# Patient Record
Sex: Male | Born: 1965 | Race: Black or African American | Hispanic: No | Marital: Single | Smoking: Never smoker
Health system: Southern US, Community
[De-identification: ages and names within clinical notes are randomized; demographics above are authoritative.]

## PROBLEM LIST (undated history)

## (undated) DIAGNOSIS — D5740 Sickle-cell thalassemia without crisis: Secondary | ICD-10-CM

## (undated) DIAGNOSIS — I10 Essential (primary) hypertension: Secondary | ICD-10-CM

## (undated) HISTORY — DX: Essential (primary) hypertension: I10

## (undated) HISTORY — DX: Sickle-cell thalassemia without crisis: D57.40

## (undated) HISTORY — PX: OTHER SURGICAL HISTORY: SHX169

---

## 2014-03-08 DIAGNOSIS — Z833 Family history of diabetes mellitus: Secondary | ICD-10-CM | POA: Insufficient documentation

## 2014-03-08 DIAGNOSIS — D5740 Sickle-cell thalassemia without crisis: Secondary | ICD-10-CM

## 2014-03-08 DIAGNOSIS — I10 Essential (primary) hypertension: Secondary | ICD-10-CM | POA: Insufficient documentation

## 2014-03-08 HISTORY — DX: Sickle-cell thalassemia without crisis: D57.40

## 2014-09-14 ENCOUNTER — Ambulatory Visit: Payer: Self-pay | Admitting: Family Medicine

## 2014-09-21 ENCOUNTER — Encounter: Payer: Self-pay | Admitting: Family Medicine

## 2014-09-21 ENCOUNTER — Ambulatory Visit (INDEPENDENT_AMBULATORY_CARE_PROVIDER_SITE_OTHER): Payer: Managed Care, Other (non HMO) | Admitting: Family Medicine

## 2014-09-21 VITALS — BP 194/104 | HR 63 | Ht 73.0 in | Wt 213.0 lb

## 2014-09-21 DIAGNOSIS — I10 Essential (primary) hypertension: Secondary | ICD-10-CM

## 2014-09-21 DIAGNOSIS — N529 Male erectile dysfunction, unspecified: Secondary | ICD-10-CM

## 2014-09-21 DIAGNOSIS — R200 Anesthesia of skin: Secondary | ICD-10-CM

## 2014-09-21 LAB — COMPLETE METABOLIC PANEL WITH GFR
ALBUMIN: 4.3 g/dL (ref 3.5–5.2)
ALT: 10 U/L (ref 0–53)
AST: 18 U/L (ref 0–37)
Alkaline Phosphatase: 50 U/L (ref 39–117)
BUN: 16 mg/dL (ref 6–23)
CALCIUM: 8.9 mg/dL (ref 8.4–10.5)
CO2: 25 mEq/L (ref 19–32)
Chloride: 104 mEq/L (ref 96–112)
Creat: 1.51 mg/dL — ABNORMAL HIGH (ref 0.50–1.35)
GFR, Est African American: 62 mL/min
GFR, Est Non African American: 53 mL/min — ABNORMAL LOW
GLUCOSE: 86 mg/dL (ref 70–99)
POTASSIUM: 3.7 meq/L (ref 3.5–5.3)
Sodium: 142 mEq/L (ref 135–145)
TOTAL PROTEIN: 7.4 g/dL (ref 6.0–8.3)
Total Bilirubin: 1.1 mg/dL (ref 0.2–1.2)

## 2014-09-21 LAB — LIPID PANEL
CHOL/HDL RATIO: 2.3 ratio
CHOLESTEROL: 144 mg/dL (ref 0–200)
HDL: 62 mg/dL (ref >=40)
LDL CALC: 74 mg/dL (ref 0–99)
Triglycerides: 39 mg/dL (ref <150)
VLDL: 8 mg/dL (ref 0–40)

## 2014-09-21 MED ORDER — LISINOPRIL-HYDROCHLOROTHIAZIDE 20-12.5 MG PO TABS: 1.0000 | ORAL_TABLET | Freq: Every day | ORAL | Status: DC

## 2014-09-21 MED ORDER — TADALAFIL 20 MG PO TABS: 20.0000 mg | ORAL_TABLET | Freq: Every day | ORAL | Status: DC | PRN

## 2014-09-21 NOTE — Progress Notes (Signed)
Isaac Bowen is a 49 y.o. male who presents to [facility]  today for establish care, discuss hypertension, and discuss groin numbness. 1) hypertension ongoing for years. Previously well controlled with lisinopril hydrochlorothiazide combination pill. He has been out of the pill for about a month. No chest pains palpitations or shortness of breath.  2) groin numbness present since February. Patient notes numbness from his left perirectal area into his left scrotum into the tip of the left side of his penis. He denies any injury back pain or incontinence. He notes some post void dribbling. He has not tried any medications for this problem. Additionally he notes difficulty sustaining an erection. This is made his sex life very challenging. He is interested in ED medications if possible.   Past Medical History  Diagnosis Date  . Hypertension    Past Surgical History  Procedure Laterality Date  . Orthoscopic     History  Substance Use Topics  . Smoking status: Never Smoker   . Smokeless tobacco: Not on file  . Alcohol Use: 0.0 oz/week    0 Standard drinks or equivalent per week   ROS as above Medications: Current Outpatient Prescriptions  Medication Sig Dispense Refill  . lisinopril-hydrochlorothiazide (PRINZIDE,ZESTORETIC) 20-12.5 MG per tablet Take 1 tablet by mouth daily. 90 tablet 1  . tadalafil (CIALIS) 20 MG tablet Take 1 tablet (20 mg total) by mouth daily as needed for erectile dysfunction. 10 tablet 0   No current facility-administered medications for this visit.   No Known Allergies   Exam:  BP 194/104 mmHg  Pulse 63  Ht 6\' 1"  (1.854 m)  Wt 213 lb (96.616 kg)  BMI 28.11 kg/m2 Gen: Well NAD HEENT: EOMI,  MMM Lungs: Normal work of breathing. CTABL Heart: RRR no MRG Abd: NABS, Soft. Nondistended, Nontender Exts: Brisk capillary refill, warm and well perfused.  Genital: No inguinal lymphadenopathy. Small skin tags present right  scrotum Testicles descended bilaterally nontender without masses. Penis is uncircumcised and normal appearing. Subjective decreased sensation left scrotum and penis Rectal: Normal anus. Normal rectal tone both active and relax. Prostate is slightly large smooth without masses. Nontender.  No results found for this or any previous visit (from the past 24 hour(s)). No results found.   Please see individual assessment and plan sections.

## 2014-09-21 NOTE — Patient Instructions (Signed)
Thank you for coming in today. Return in 1 month to recheck blood pressure and labs.  Call or go to the emergency room if you get worse, have trouble breathing, have chest pains, or palpitations.  Come back or go to the emergency room if you notice new weakness new numbness problems walking or bowel or bladder problems.  Hypertension Hypertension, commonly called high blood pressure, is when the force of blood pumping through your arteries is too strong. Your arteries are the blood vessels that carry blood from your heart throughout your body. A blood pressure reading consists of a higher number over a lower number, such as 110/72. The higher number (systolic) is the pressure inside your arteries when your heart pumps. The lower number (diastolic) is the pressure inside your arteries when your heart relaxes. Ideally you want your blood pressure below 120/80. Hypertension forces your heart to work harder to pump blood. Your arteries may become narrow or stiff. Having hypertension puts you at risk for heart disease, stroke, and other problems.  RISK FACTORS Some risk factors for high blood pressure are controllable. Others are not.  Risk factors you cannot control include:   Race. You may be at higher risk if you are African American.  Age. Risk increases with age.  Gender. Men are at higher risk than women before age 61 years. After age 65, women are at higher risk than men. Risk factors you can control include:  Not getting enough exercise or physical activity.  Being overweight.  Getting too much fat, sugar, calories, or salt in your diet.  Drinking too much alcohol. SIGNS AND SYMPTOMS Hypertension does not usually cause signs or symptoms. Extremely high blood pressure (hypertensive crisis) may cause headache, anxiety, shortness of breath, and nosebleed. DIAGNOSIS  To check if you have hypertension, your health care provider will measure your blood pressure while you are seated, with  your arm held at the level of your heart. It should be measured at least twice using the same arm. Certain conditions can cause a difference in blood pressure between your right and left arms. A blood pressure reading that is higher than normal on one occasion does not mean that you need treatment. If one blood pressure reading is high, ask your health care provider about having it checked again. TREATMENT  Treating high blood pressure includes making lifestyle changes and possibly taking medicine. Living a healthy lifestyle can help lower high blood pressure. You may need to change some of your habits. Lifestyle changes may include:  Following the DASH diet. This diet is high in fruits, vegetables, and whole grains. It is low in salt, red meat, and added sugars.  Getting at least 2 hours of brisk physical activity every week.  Losing weight if necessary.  Not smoking.  Limiting alcoholic beverages.  Learning ways to reduce stress. If lifestyle changes are not enough to get your blood pressure under control, your health care provider may prescribe medicine. You may need to take more than one. Work closely with your health care provider to understand the risks and benefits. HOME CARE INSTRUCTIONS  Have your blood pressure rechecked as directed by your health care provider.   Take medicines only as directed by your health care provider. Follow the directions carefully. Blood pressure medicines must be taken as prescribed. The medicine does not work as well when you skip doses. Skipping doses also puts you at risk for problems.   Do not smoke.   Monitor your blood pressure  at home as directed by your health care provider. SEEK MEDICAL CARE IF:   You think you are having a reaction to medicines taken.  You have recurrent headaches or feel dizzy.  You have swelling in your ankles.  You have trouble with your vision. SEEK IMMEDIATE MEDICAL CARE IF:  You develop a severe headache  or confusion.  You have unusual weakness, numbness, or feel faint.  You have severe chest or abdominal pain.  You vomit repeatedly.  You have trouble breathing. MAKE SURE YOU:   Understand these instructions.  Will watch your condition.  Will get help right away if you are not doing well or get worse. Document Released: 02/18/2005 Document Revised: 07/05/2013 Document Reviewed: 12/11/2012 [facility] Patient Information 2015 [place], [place]. This information is not intended to replace advice given to you by your health care provider. Make sure you discuss any questions you have with your health care provider.

## 2014-09-21 NOTE — Assessment & Plan Note (Signed)
Left groin numbness. Concern with a sacral nerve root impingement. X-ray of lumbar spine and sacrum pending. We'll consider MRI in the near future.

## 2014-09-21 NOTE — Assessment & Plan Note (Addendum)
Rx  lisinopril/hydrocodone as a prescription. Check metabolic panel, lipid panel, and CBC. Return in one month for blood pressure recheck

## 2014-09-21 NOTE — Assessment & Plan Note (Signed)
Likely related to numbness. Workup as above. Gave 3 samples of cilias and a rx for 10 20mg  tabs.

## 2014-09-22 ENCOUNTER — Telehealth: Payer: Self-pay | Admitting: Family Medicine

## 2014-09-22 DIAGNOSIS — N183 Chronic kidney disease, stage 3 unspecified: Secondary | ICD-10-CM | POA: Insufficient documentation

## 2014-09-22 LAB — CBC
HEMATOCRIT: 38.3 % — AB (ref 39.0–52.0)
HEMOGLOBIN: 13.2 g/dL (ref 13.0–17.0)
MCH: 27.6 pg (ref 26.0–34.0)
MCHC: 34.5 g/dL (ref 30.0–36.0)
MCV: 80 fL (ref 78.0–100.0)
PLATELETS: 128 10*3/uL — AB (ref 150–400)
RBC: 4.79 MIL/uL (ref 4.22–5.81)
RDW: 15.9 % — ABNORMAL HIGH (ref 11.5–15.5)
WBC: 4.2 10*3/uL (ref 4.0–10.5)

## 2014-09-22 NOTE — Telephone Encounter (Signed)
Creatine elevated.  No change in medicine.  Pt need MD visit in 1 month to recheck BP and check kidney function again.

## 2014-09-23 NOTE — Telephone Encounter (Signed)
Pt notified of results and recommendations.

## 2014-10-20 ENCOUNTER — Encounter: Payer: Self-pay | Admitting: Family Medicine

## 2014-10-20 ENCOUNTER — Ambulatory Visit (INDEPENDENT_AMBULATORY_CARE_PROVIDER_SITE_OTHER): Payer: Managed Care, Other (non HMO) | Admitting: Family Medicine

## 2014-10-20 VITALS — BP 177/100 | HR 75 | Wt 216.0 lb

## 2014-10-20 DIAGNOSIS — N183 Chronic kidney disease, stage 3 unspecified: Secondary | ICD-10-CM

## 2014-10-20 DIAGNOSIS — I10 Essential (primary) hypertension: Secondary | ICD-10-CM | POA: Diagnosis not present

## 2014-10-20 LAB — BASIC METABOLIC PANEL
BUN: 13 mg/dL (ref 7–25)
CO2: 24 mmol/L (ref 20–31)
Calcium: 9 mg/dL (ref 8.6–10.3)
Chloride: 104 mmol/L (ref 98–110)
Creat: 1.39 mg/dL — ABNORMAL HIGH (ref 0.60–1.35)
Glucose, Bld: 75 mg/dL (ref 65–99)
Potassium: 4.5 mmol/L (ref 3.5–5.3)
Sodium: 137 mmol/L (ref 135–146)

## 2014-10-20 MED ORDER — AMLODIPINE BESYLATE 10 MG PO TABS
10.0000 mg | ORAL_TABLET | Freq: Every day | ORAL | Status: DC
Start: 2014-10-20 — End: 2015-03-24

## 2014-10-20 NOTE — Progress Notes (Signed)
Isaac Bowen is a 49 y.o. male who presents to [facility]  today for follow-up hypertension. Patient was seen about a month ago for hypertension and was started on lisinopril hydrochlorothiazide. Labs were obtained and he was found to have CKD stage III with a creatinine of 1.5. He feels well otherwise with no chest pain palpitations or shortness of breath. He's tolerating the blood pressure medication well.   Past Medical History  Diagnosis Date  . Hypertension    Past Surgical History  Procedure Laterality Date  . Orthoscopic     Social History  Substance Use Topics  . Smoking status: Never Smoker   . Smokeless tobacco: Not on file  . Alcohol Use: 0.0 oz/week    0 Standard drinks or equivalent per week   ROS as above Medications: Current Outpatient Prescriptions  Medication Sig Dispense Refill  . lisinopril-hydrochlorothiazide (PRINZIDE,ZESTORETIC) 20-12.5 MG per tablet Take 1 tablet by mouth daily. 90 tablet 1  . tadalafil (CIALIS) 20 MG tablet Take 1 tablet (20 mg total) by mouth daily as needed for erectile dysfunction. 10 tablet 0  . amLODipine (NORVASC) 10 MG tablet Take 1 tablet (10 mg total) by mouth daily. 30 tablet 3   No current facility-administered medications for this visit.   No Known Allergies   Exam:  BP 177/100 mmHg  Pulse 75  Wt 216 lb (97.977 kg) Gen: Well NAD HEENT: EOMI,  MMM Lungs: Normal work of breathing. CTABL Heart: RRR no MRG Abd: NABS, Soft. Nondistended, Nontender Exts: Brisk capillary refill, warm and well perfused.   No results found for this or any previous visit (from the past 24 hour(s)). No results found.   Please see individual assessment and plan sections.

## 2014-10-20 NOTE — Assessment & Plan Note (Signed)
Not well-controlled. Increased blood pressure medicine to amlodipine. Continue with lisinopril hydrochlorothiazide

## 2014-10-20 NOTE — Assessment & Plan Note (Signed)
Recheck metabolic panel today.   

## 2014-10-20 NOTE — Patient Instructions (Signed)
Thank you for coming in today. Return in 3-4 weeks.  Call or go to the emergency room if you get worse, have trouble breathing, have chest pains, or palpitations.   Start amlodipine daily.

## 2014-10-21 NOTE — Progress Notes (Signed)
Quick Note:  Normal, no changes. ______ 

## 2014-11-18 ENCOUNTER — Ambulatory Visit: Payer: Managed Care, Other (non HMO) | Admitting: Family Medicine

## 2014-11-25 ENCOUNTER — Encounter: Payer: Self-pay | Admitting: Family Medicine

## 2014-11-25 ENCOUNTER — Ambulatory Visit (INDEPENDENT_AMBULATORY_CARE_PROVIDER_SITE_OTHER): Payer: Managed Care, Other (non HMO) | Admitting: Family Medicine

## 2014-11-25 VITALS — BP 152/100 | HR 68 | Wt 218.0 lb

## 2014-11-25 DIAGNOSIS — N529 Male erectile dysfunction, unspecified: Secondary | ICD-10-CM | POA: Diagnosis not present

## 2014-11-25 DIAGNOSIS — I10 Essential (primary) hypertension: Secondary | ICD-10-CM | POA: Diagnosis not present

## 2014-11-25 MED ORDER — SILDENAFIL CITRATE 20 MG PO TABS: 20.0000 mg | ORAL_TABLET | ORAL | Status: DC | PRN

## 2014-11-25 MED ORDER — METOPROLOL SUCCINATE ER 50 MG PO TB24: 50.0000 mg | ORAL_TABLET | Freq: Every day | ORAL | Status: DC

## 2014-11-25 NOTE — Assessment & Plan Note (Signed)
Switch to generic sildenafil at [facility]

## 2014-11-25 NOTE — Progress Notes (Signed)
Isaac Bowen is a 49 y.o. male who presents to [place]  today for blood pressure follow-up. He has taken the lisinopril/hydrochlorothiazide combination tablet as well as the amlodipine. He feels well with no side effects. No chest pains palpitations or shortness of breath. He feels well otherwise.  Erectile dysfunction: Patient continues to experience erectile dysfunction. He was unable to fill the Cialis as it was too expensive.    Past Medical History  Diagnosis Date  . Hypertension    Past Surgical History  Procedure Laterality Date  . Orthoscopic     Social History  Substance Use Topics  . Smoking status: Never Smoker   . Smokeless tobacco: Not on file  . Alcohol Use: 0.0 oz/week    0 Standard drinks or equivalent per week   family history includes Cancer in his mother; Diabetes in his father; Hyperlipidemia in his father; Hypertension in his brother and father; Stroke in his father.  ROS as above Medications: Current Outpatient Prescriptions  Medication Sig Dispense Refill  . amLODipine (NORVASC) 10 MG tablet Take 1 tablet (10 mg total) by mouth daily. 30 tablet 3  . lisinopril-hydrochlorothiazide (PRINZIDE,ZESTORETIC) 20-12.5 MG per tablet Take 1 tablet by mouth daily. 90 tablet 1  . metoprolol succinate (TOPROL XL) 50 MG 24 hr tablet Take 1 tablet (50 mg total) by mouth daily. Take with or immediately following a meal. 30 tablet 1  . sildenafil (REVATIO) 20 MG tablet Take 1 tablet (20 mg total) by mouth as needed. 50 tablet 11   No current facility-administered medications for this visit.   No Known Allergies   Exam:  BP 152/100 mmHg  Pulse 68  Wt 218 lb (98.884 kg) Gen: Well NAD HEENT: EOMI,  MMM Lungs: Normal work of breathing. CTABL Heart: RRR no MRG Abd: NABS, Soft. Nondistended, Nontender Exts: Brisk capillary refill, warm and well perfused.   No results found for this or any previous visit (from the past 24  hour(s)). No results found.   Please see individual assessment and plan sections.

## 2014-11-25 NOTE — Patient Instructions (Signed)
Thank you for coming in today. Start metoprolol daily.  Continue other blood pressure medicine . Call [facility] [phone] about the viagra rx.  Call or go to the emergency room if you get worse, have trouble breathing, have chest pains, or palpitations.   Return in 1 month.

## 2014-11-25 NOTE — Assessment & Plan Note (Signed)
Add metoprolol. Recheck 1 month

## 2015-03-24 ENCOUNTER — Encounter: Payer: Self-pay | Admitting: Family Medicine

## 2015-03-24 ENCOUNTER — Ambulatory Visit (INDEPENDENT_AMBULATORY_CARE_PROVIDER_SITE_OTHER): Payer: Managed Care, Other (non HMO) | Admitting: Family Medicine

## 2015-03-24 VITALS — BP 200/121 | HR 73 | Ht 73.0 in | Wt 221.0 lb

## 2015-03-24 DIAGNOSIS — N183 Chronic kidney disease, stage 3 unspecified: Secondary | ICD-10-CM

## 2015-03-24 DIAGNOSIS — Z114 Encounter for screening for human immunodeficiency virus [HIV]: Secondary | ICD-10-CM

## 2015-03-24 DIAGNOSIS — Z23 Encounter for immunization: Secondary | ICD-10-CM | POA: Diagnosis not present

## 2015-03-24 DIAGNOSIS — I10 Essential (primary) hypertension: Secondary | ICD-10-CM

## 2015-03-24 LAB — RENAL FUNCTION PANEL
ALBUMIN: 4.4 g/dL (ref 3.6–5.1)
BUN: 13 mg/dL (ref 7–25)
CALCIUM: 8.7 mg/dL (ref 8.6–10.3)
CHLORIDE: 107 mmol/L (ref 98–110)
CO2: 26 mmol/L (ref 20–31)
CREATININE: 1.52 mg/dL — AB (ref 0.60–1.35)
Glucose, Bld: 78 mg/dL (ref 65–99)
PHOSPHORUS: 1.9 mg/dL — AB (ref 2.5–4.5)
Potassium: 3.9 mmol/L (ref 3.5–5.3)
SODIUM: 142 mmol/L (ref 135–146)

## 2015-03-24 MED ORDER — LISINOPRIL-HYDROCHLOROTHIAZIDE 20-12.5 MG PO TABS: 1.0000 | ORAL_TABLET | Freq: Every day | ORAL | Status: DC

## 2015-03-24 MED ORDER — AMLODIPINE BESYLATE 10 MG PO TABS: 10.0000 mg | ORAL_TABLET | Freq: Every day | ORAL | Status: DC

## 2015-03-24 NOTE — Progress Notes (Signed)
       Isaac Bowen is a 50 y.o. male who presents to [place] today for follow-up hypertension, CKD.  1) hypertension: Patient was last seen in September and was taking amlodipine and lisinopril/hydrochlorothiazide. This was insufficient to completely control his blood pressure. He was started on metoprolol and asked to follow-up in one month. He was lost to follow-up. He did not realize he needed to make a new appointment. Of note he was unable to tolerate the metoprolol stating that it caused diarrhea and headaches. Currently he feels well with no chest pains palpitations or shortness of breath or swelling.  2) chronic kidney disease: Patient has stage III chronic kidney disease. This has not been fully evaluated at this point. He denies any leg swelling.  Past Medical History  Diagnosis Date  . Hypertension    Past Surgical History  Procedure Laterality Date  . Orthoscopic     Social History  Substance Use Topics  . Smoking status: Never Smoker   . Smokeless tobacco: Not on file  . Alcohol Use: 0.0 oz/week    0 Standard drinks or equivalent per week   family history includes Cancer in his mother; Diabetes in his father; Hyperlipidemia in his father; Hypertension in his brother and father; Stroke in his father.  ROS as above Medications: Current Outpatient Prescriptions  Medication Sig Dispense Refill  . amLODipine (NORVASC) 10 MG tablet Take 1 tablet (10 mg total) by mouth daily. 30 tablet 0  . lisinopril-hydrochlorothiazide (PRINZIDE,ZESTORETIC) 20-12.5 MG tablet Take 1 tablet by mouth daily. 30 tablet 0  . sildenafil (REVATIO) 20 MG tablet Take 1 tablet (20 mg total) by mouth as needed. 50 tablet 11   No current facility-administered medications for this visit.   Allergies  Allergen Reactions  . Metoprolol Diarrhea and Other (See Comments)    Chest pain     Exam:  BP  200/121 mmHg  Pulse 73  Ht 6\' 1"  (1.854 m)  Wt 221 lb (100.245 kg)  BMI 29.16 kg/m2 Gen: Well NAD HEENT: EOMI,  MMM Lungs: Normal work of breathing. CTABL Heart: RRR no MRG Abd: NABS, Soft. Nondistended, Nontender Exts: Brisk capillary refill, warm and well perfused. No lower extremity edema present.  Patient was given Tdap, and influenza vaccines today prior to discharge.  No results found for this or any previous visit (from the past 24 hour(s)). No results found.   Please see individual assessment and plan sections.

## 2015-03-24 NOTE — Assessment & Plan Note (Signed)
Check renal function panel. Patient likely would benefit from referral to nephrology if still stage III or worse.

## 2015-03-24 NOTE — Patient Instructions (Signed)
Thank you for coming in today. Return in 2-3 weeks.  Restart blood pressure medicine.  Get labs today.

## 2015-03-24 NOTE — Assessment & Plan Note (Signed)
Uncontrolled. Restart amlodipine and hydrochlorothiazide/lisinopril. Check renal function panel. Return in 2-3 weeks.

## 2015-03-25 LAB — HIV ANTIBODY (ROUTINE TESTING W REFLEX): HIV 1&2 Ab, 4th Generation: NONREACTIVE

## 2015-03-27 ENCOUNTER — Encounter: Payer: Self-pay | Admitting: Family Medicine

## 2015-03-27 DIAGNOSIS — N183 Chronic kidney disease, stage 3 unspecified: Secondary | ICD-10-CM | POA: Insufficient documentation

## 2015-03-27 NOTE — Progress Notes (Signed)
Quick Note:  Labs do not show much change. This is good. ______

## 2015-04-13 ENCOUNTER — Encounter: Payer: Self-pay | Admitting: Family Medicine

## 2015-04-13 ENCOUNTER — Ambulatory Visit (INDEPENDENT_AMBULATORY_CARE_PROVIDER_SITE_OTHER): Payer: Managed Care, Other (non HMO) | Admitting: Family Medicine

## 2015-04-13 VITALS — BP 133/74 | HR 69 | Wt 231.0 lb

## 2015-04-13 DIAGNOSIS — N529 Male erectile dysfunction, unspecified: Secondary | ICD-10-CM

## 2015-04-13 DIAGNOSIS — N182 Chronic kidney disease, stage 2 (mild): Secondary | ICD-10-CM

## 2015-04-13 DIAGNOSIS — I10 Essential (primary) hypertension: Secondary | ICD-10-CM

## 2015-04-13 LAB — BASIC METABOLIC PANEL WITH GFR
BUN: 14 mg/dL (ref 7–25)
CO2: 27 mmol/L (ref 20–31)
Calcium: 9 mg/dL (ref 8.6–10.3)
Chloride: 104 mmol/L (ref 98–110)
Creat: 1.37 mg/dL — ABNORMAL HIGH (ref 0.60–1.35)
GFR, EST NON AFRICAN AMERICAN: 60 mL/min (ref >=60)
GFR, Est African American: 70 mL/min (ref >=60)
Glucose, Bld: 90 mg/dL (ref 65–99)
POTASSIUM: 4.3 mmol/L (ref 3.5–5.3)
SODIUM: 138 mmol/L (ref 135–146)

## 2015-04-13 MED ORDER — AMLODIPINE BESYLATE 10 MG PO TABS: 10.0000 mg | ORAL_TABLET | Freq: Every day | ORAL | Status: DC

## 2015-04-13 MED ORDER — LISINOPRIL-HYDROCHLOROTHIAZIDE 20-12.5 MG PO TABS: 1.0000 | ORAL_TABLET | Freq: Every day | ORAL | Status: DC

## 2015-04-13 NOTE — Assessment & Plan Note (Signed)
Chronic, stable based on labs 3 weeks ago. Will check kidney function today given stop and restart of HCTZ and lisinopril. Follow up in 6 months.

## 2015-04-13 NOTE — Progress Notes (Signed)
       Isaac Bowen is a 50 y.o. male who presents to [place] today for follow up hypertension.  HTN: Patient has done well with the medications. No peripheral edema, chest pain, palpitations, headaches, vision changes. No other acute complaints today.   ED: Patient using herbal remedies to adequate success. Will fill Cialis prescription if needed.   Past Medical History  Diagnosis Date  . Hypertension    Past Surgical History  Procedure Laterality Date  . Orthoscopic     Social History  Substance Use Topics  . Smoking status: Never Smoker   . Smokeless tobacco: Not on file  . Alcohol Use: 0.0 oz/week    0 Standard drinks or equivalent per week   family history includes Cancer in his mother; Diabetes in his father; Hyperlipidemia in his father; Hypertension in his brother and father; Stroke in his father.  ROS as above Medications: Current Outpatient Prescriptions  Medication Sig Dispense Refill  . amLODipine (NORVASC) 10 MG tablet Take 1 tablet (10 mg total) by mouth daily. 90 tablet 1  . lisinopril-hydrochlorothiazide (PRINZIDE,ZESTORETIC) 20-12.5 MG tablet Take 1 tablet by mouth daily. 90 tablet 1  . sildenafil (REVATIO) 20 MG tablet Take 1 tablet (20 mg total) by mouth as needed. 50 tablet 11   No current facility-administered medications for this visit.   Allergies  Allergen Reactions  . Metoprolol Diarrhea and Other (See Comments)    Chest pain     Exam:  BP 133/74 mmHg  Pulse 69  Wt 231 lb (104.781 kg)  SpO2 100% Gen: Well appearing gentleman in NAD HEENT: EOMI,  MMM Lungs: Normal work of breathing. CTABL Heart: RRR no MRG Abd: NABS, Soft. Nondistended, Nontender Exts: Brisk capillary refill, warm and well perfused. No peripheral edema noted.   No results found for this or any previous visit (from the past 24 hour(s)). No results found.   Please see  individual assessment and plan sections.

## 2015-04-13 NOTE — Assessment & Plan Note (Signed)
Stable. Patient will start medication if needed.

## 2015-04-13 NOTE — Patient Instructions (Signed)
Thank you for coming in today. You were seen for follow up for your high blood pressure. This looks great today! Keep taking your medication as prescribed. Your medication acts on your kidneys, and since you restarted this medication and had decreased kidney function in the past, we will check your kidney function today. Please come back in 6 months or sooner as needed.

## 2015-04-13 NOTE — Assessment & Plan Note (Signed)
Chronic, well-controlled on current regimen. Continue medications. Check kidney function today. RTC in 6 months.

## 2015-04-14 ENCOUNTER — Ambulatory Visit: Payer: Managed Care, Other (non HMO) | Admitting: Family Medicine

## 2015-04-14 NOTE — Progress Notes (Signed)
Quick Note:    Labs look good.  ______

## 2015-10-11 ENCOUNTER — Encounter: Payer: Self-pay | Admitting: Family Medicine

## 2015-10-11 ENCOUNTER — Ambulatory Visit (INDEPENDENT_AMBULATORY_CARE_PROVIDER_SITE_OTHER): Payer: BLUE CROSS/BLUE SHIELD | Admitting: Family Medicine

## 2015-10-11 VITALS — BP 131/84 | HR 63

## 2015-10-11 DIAGNOSIS — N182 Chronic kidney disease, stage 2 (mild): Secondary | ICD-10-CM | POA: Diagnosis not present

## 2015-10-11 DIAGNOSIS — Z0000 Encounter for general adult medical examination without abnormal findings: Secondary | ICD-10-CM | POA: Diagnosis not present

## 2015-10-11 DIAGNOSIS — Z1211 Encounter for screening for malignant neoplasm of colon: Secondary | ICD-10-CM

## 2015-10-11 DIAGNOSIS — IMO0001 Reserved for inherently not codable concepts without codable children: Secondary | ICD-10-CM

## 2015-10-11 DIAGNOSIS — Z125 Encounter for screening for malignant neoplasm of prostate: Secondary | ICD-10-CM | POA: Diagnosis not present

## 2015-10-11 DIAGNOSIS — I10 Essential (primary) hypertension: Secondary | ICD-10-CM

## 2015-10-11 DIAGNOSIS — D5740 Sickle-cell thalassemia without crisis: Secondary | ICD-10-CM

## 2015-10-11 LAB — CBC
HCT: 35.3 % — ABNORMAL LOW (ref 38.5–50.0)
Hemoglobin: 12 g/dL — ABNORMAL LOW (ref 13.2–17.1)
MCH: 26.9 pg — ABNORMAL LOW (ref 27.0–33.0)
MCHC: 34 g/dL (ref 32.0–36.0)
MCV: 79.1 fL — ABNORMAL LOW (ref 80.0–100.0)
Platelets: 160 10*3/uL (ref 140–400)
RBC: 4.46 MIL/uL (ref 4.20–5.80)
RDW: 16.9 % — ABNORMAL HIGH (ref 11.0–15.0)
WBC: 4.5 10*3/uL (ref 3.8–10.8)

## 2015-10-11 LAB — TSH: TSH: 1.92 m[IU]/L (ref 0.40–4.50)

## 2015-10-11 LAB — PSA: PSA: 1.1 ng/mL

## 2015-10-11 MED ORDER — AMLODIPINE BESYLATE 10 MG PO TABS: 10.0000 mg | ORAL_TABLET | Freq: Every day | ORAL | 1 refills | Status: DC

## 2015-10-11 MED ORDER — LISINOPRIL-HYDROCHLOROTHIAZIDE 20-12.5 MG PO TABS: 1.0000 | ORAL_TABLET | Freq: Every day | ORAL | 1 refills | Status: DC

## 2015-10-11 NOTE — Patient Instructions (Signed)
Thank you for coming in today. Make a nurse visit for flu vaccine soon.  Get labs today.  You should hear from the cologuard people soon about colon cancer screening.  Return in 6 months for a recheck.  Call or go to the emergency room if you get worse, have trouble breathing, have chest pains, or palpitations.

## 2015-10-11 NOTE — Progress Notes (Signed)
       Isaac Bowen is a 50 y.o. male who presents to [place] [place] today for well visit follow-up hypertension and health maintenance. Patient is doing very well over the last 6 months. The denies any significant health complaints no fevers or chills nausea vomiting or diarrhea. He changed jobs to one that has better time off and benefits. He has no abdominal pain chest pain palpitations shortness of breath fevers or chills. He takes the medications listed below. Additionally he notes that he recently got engaged.   Past Medical History:  Diagnosis Date  . Hypertension    Past Surgical History:  Procedure Laterality Date  . orthoscopic     Social History  Substance Use Topics  . Smoking status: Never Smoker  . Smokeless tobacco: Not on file  . Alcohol use 0.0 oz/week   family history includes Cancer in his mother; Diabetes in his father; Hyperlipidemia in his father; Hypertension in his brother and father; Stroke in his father.  ROS as above:  Medications: Current Outpatient Prescriptions  Medication Sig Dispense Refill  . amLODipine (NORVASC) 10 MG tablet Take 1 tablet (10 mg total) by mouth daily. 90 tablet 1  . lisinopril-hydrochlorothiazide (PRINZIDE,ZESTORETIC) 20-12.5 MG tablet Take 1 tablet by mouth daily. 90 tablet 1  . sildenafil (REVATIO) 20 MG tablet Take 1 tablet (20 mg total) by mouth as needed. 50 tablet 11   No current facility-administered medications for this visit.    Allergies  Allergen Reactions  . Metoprolol Diarrhea and Other (See Comments)    Chest pain     Exam:  BP 131/84   Pulse 63  Gen: Well NAD HEENT: EOMI,  MMM Lungs: Normal work of breathing. CTABL Heart: RRR no MRG Abd: NABS, Soft. Nondistended, Nontender Exts: Brisk capillary refill, warm and well perfused.   No results found for this or any previous visit (from the past 24  hour(s)). No results found.    Assessment and Plan: 50 y.o. male with Well visit. Plan to check basic fasting labs. Additionally have ordered Cologuard for colon cancer screening and PSA for prostate cancer screening. We had a lengthy discussion about the uncertainty regarding prostate cancer screening. Additionally will return in the near future for a influenza vaccine. Return in 6 months or sooner if needed.   Orders Placed This Encounter  Procedures  . CBC  . Comprehensive metabolic panel    Order Specific Question:   Has the patient fasted?    Answer:   No  . Lipid panel    Order Specific Question:   Has the patient fasted?    Answer:   No  . PSA  . TSH  . VITAMIN D 25 Hydroxy (Vit-D Deficiency, Fractures)  . Hemoglobin A1c    Discussed warning signs or symptoms. Please see discharge instructions. Patient expresses understanding.

## 2015-10-12 ENCOUNTER — Encounter: Payer: Self-pay | Admitting: Family Medicine

## 2015-10-12 DIAGNOSIS — E559 Vitamin D deficiency, unspecified: Secondary | ICD-10-CM | POA: Insufficient documentation

## 2015-10-12 LAB — COMPREHENSIVE METABOLIC PANEL
ALT: 10 U/L (ref 9–46)
AST: 15 U/L (ref 10–35)
Albumin: 4.5 g/dL (ref 3.6–5.1)
Alkaline Phosphatase: 57 U/L (ref 40–115)
BILIRUBIN TOTAL: 1.1 mg/dL (ref 0.2–1.2)
BUN: 19 mg/dL (ref 7–25)
CO2: 29 mmol/L (ref 20–31)
CREATININE: 1.66 mg/dL — AB (ref 0.70–1.33)
Calcium: 9.1 mg/dL (ref 8.6–10.3)
Chloride: 100 mmol/L (ref 98–110)
GLUCOSE: 91 mg/dL (ref 65–99)
Potassium: 4.3 mmol/L (ref 3.5–5.3)
SODIUM: 139 mmol/L (ref 135–146)
Total Protein: 7 g/dL (ref 6.1–8.1)

## 2015-10-12 LAB — VITAMIN D 25 HYDROXY (VIT D DEFICIENCY, FRACTURES): VIT D 25 HYDROXY: 26 ng/mL — AB (ref 30–100)

## 2015-10-12 LAB — HEMOGLOBIN A1C
HEMOGLOBIN A1C: 4.1 % (ref <5.7)
Mean Plasma Glucose: 71 mg/dL

## 2015-10-12 LAB — LIPID PANEL
Cholesterol: 139 mg/dL (ref 125–200)
HDL: 66 mg/dL (ref >=40)
LDL CALC: 64 mg/dL (ref <130)
TRIGLYCERIDES: 44 mg/dL (ref <150)
Total CHOL/HDL Ratio: 2.1 Ratio (ref <=5.0)
VLDL: 9 mg/dL (ref <30)

## 2015-11-09 LAB — COLOGUARD: COLOGUARD: NEGATIVE

## 2015-12-08 ENCOUNTER — Encounter: Payer: Self-pay | Admitting: Family Medicine

## 2015-12-24 ENCOUNTER — Emergency Department
Admission: EM | Admit: 2015-12-24 | Discharge: 2015-12-24 | Disposition: A | Discharge status: Other Acute Inpatient Hospital | Payer: BLUE CROSS/BLUE SHIELD | Source: Home / Self Care | Attending: Emergency Medicine | Admitting: Emergency Medicine

## 2015-12-24 ENCOUNTER — Encounter: Payer: Self-pay | Admitting: Emergency Medicine

## 2015-12-24 DIAGNOSIS — D696 Thrombocytopenia, unspecified: Secondary | ICD-10-CM | POA: Insufficient documentation

## 2015-12-24 DIAGNOSIS — R1012 Left upper quadrant pain: Secondary | ICD-10-CM

## 2015-12-24 DIAGNOSIS — N183 Chronic kidney disease, stage 3 (moderate): Secondary | ICD-10-CM

## 2015-12-24 DIAGNOSIS — E876 Hypokalemia: Secondary | ICD-10-CM | POA: Insufficient documentation

## 2015-12-24 DIAGNOSIS — D735 Infarction of spleen: Secondary | ICD-10-CM | POA: Insufficient documentation

## 2015-12-24 DIAGNOSIS — N179 Acute kidney failure, unspecified: Secondary | ICD-10-CM | POA: Insufficient documentation

## 2015-12-24 NOTE — Discharge Instructions (Signed)
Go to the Emergency department for evaluation  

## 2015-12-24 NOTE — ED Triage Notes (Signed)
Patient presents to [facility] with C/O severe abd pain 7-8/10 worse with movement, Patient has had pain and high fever times 4 days, he advised that he has had issues with passing liquid stool he feels he has a bowel blockage. forced BM yesterday and pain increased after, abd slightly distended unable to assess bowel sounds.

## 2015-12-24 NOTE — ED Provider Notes (Signed)
CSN: [id number]     Arrival date & time 12/24/15  1252 History   None    No chief complaint on file.  (Consider location/radiation/quality/duration/timing/severity/associated sxs/prior Treatment) The history is provided by the patient. No language interpreter was used.  Pt reports he has been sick since Thursday.  Fever of 102.  Pt reports he is unable to eat.  Pt complains of severe pain in left upper abdomen.   Pt reports he has been drinking water and baking soda. Pt reports pain is in left upper quadrant   Past Medical History:  Diagnosis Date  . Hypertension    Past Surgical History:  Procedure Laterality Date  . orthoscopic     Family History  Problem Relation Age of Onset  . Cancer Mother   . Diabetes Father   . Hyperlipidemia Father   . Hypertension Father   . Stroke Father   . Hypertension Brother    Social History  Substance Use Topics  . Smoking status: Never Smoker  . Smokeless tobacco: Not on file  . Alcohol use 0.0 oz/week    Review of Systems  Constitutional: Positive for fever.  Gastrointestinal: Positive for abdominal pain.  All other systems reviewed and are negative.   Allergies  Metoprolol  Home Medications   Prior to Admission medications   Medication Sig Start Date End Date Taking? Authorizing Provider  amLODipine (NORVASC) 10 MG tablet Take 1 tablet (10 mg total) by mouth daily. 10/11/15   Gregor Hams, MD  lisinopril-hydrochlorothiazide (PRINZIDE,ZESTORETIC) 20-12.5 MG tablet Take 1 tablet by mouth daily. 10/11/15   Gregor Hams, MD  sildenafil (REVATIO) 20 MG tablet Take 1 tablet (20 mg total) by mouth as needed. 11/25/14   Gregor Hams, MD   Meds Ordered and Administered this Visit  Medications - No data to display  There were no vitals taken for this visit. No data found.   Physical Exam  Constitutional: He appears well-developed and well-nourished.  HENT:  Head: Normocephalic and atraumatic.  Eyes: Conjunctivae are normal.  Neck:  Neck supple.  Cardiovascular: Normal rate and regular rhythm.   No murmur heard. Pulmonary/Chest: Effort normal and breath sounds normal. No respiratory distress.  Abdominal: Soft. He exhibits no mass. There is tenderness. There is no rebound.  Tender left upper quadrant.    Musculoskeletal: He exhibits no edema.  Neurological: He is alert.  Skin: Skin is warm and dry.  Psychiatric: He has a normal mood and affect.  Nursing note and vitals reviewed.   Urgent Care Course   Clinical Course    Procedures (including critical care time)  Labs Review Labs Reviewed - No data to display  Imaging Review No results found.   Visual Acuity Review  Right Eye Distance:   Left Eye Distance:   Bilateral Distance:    Right Eye Near:   Left Eye Near:    Bilateral Near:       Pt has temp of 102.  Abdomen tender left upper quadrant.  Slightly distended.  Pt looks sick.    MDM   1. Left upper quadrant pain      I counseled pt I think he needs Emergency department evaluation.  He needs blood work and possible ct scan.    (Family unhappy we can not take care of pt here, I apologized and advised pt needs evaluation I can not provide here.)     Fransico Meadow, PA-C 12/24/15 [place], [place] 12/24/15  1350  

## 2015-12-28 ENCOUNTER — Telehealth: Payer: Self-pay | Admitting: *Deleted

## 2015-12-28 NOTE — Telephone Encounter (Signed)
Callback: No answer, LMOM f/u from visit. Call back as needed. 

## 2016-01-02 ENCOUNTER — Ambulatory Visit (INDEPENDENT_AMBULATORY_CARE_PROVIDER_SITE_OTHER): Payer: BLUE CROSS/BLUE SHIELD | Admitting: Family Medicine

## 2016-01-02 VITALS — BP 118/83 | HR 77 | Wt 218.0 lb

## 2016-01-02 DIAGNOSIS — N179 Acute kidney failure, unspecified: Secondary | ICD-10-CM

## 2016-01-02 DIAGNOSIS — D696 Thrombocytopenia, unspecified: Secondary | ICD-10-CM

## 2016-01-02 DIAGNOSIS — D5740 Sickle-cell thalassemia without crisis: Secondary | ICD-10-CM

## 2016-01-02 DIAGNOSIS — N183 Chronic kidney disease, stage 3 unspecified: Secondary | ICD-10-CM

## 2016-01-02 DIAGNOSIS — D735 Infarction of spleen: Secondary | ICD-10-CM | POA: Diagnosis not present

## 2016-01-02 DIAGNOSIS — I10 Essential (primary) hypertension: Secondary | ICD-10-CM

## 2016-01-02 LAB — CBC WITH DIFFERENTIAL/PLATELET
BASOS PCT: 0 %
Basophils Absolute: 0 cells/uL (ref 0–200)
EOS ABS: 146 {cells}/uL (ref 15–500)
Eosinophils Relative: 2 %
HEMATOCRIT: 28.7 % — AB (ref 38.5–50.0)
HEMOGLOBIN: 9.6 g/dL — AB (ref 13.2–17.1)
LYMPHS ABS: 1679 {cells}/uL (ref 850–3900)
LYMPHS PCT: 23 %
MCH: 25.8 pg — ABNORMAL LOW (ref 27.0–33.0)
MCHC: 33.4 g/dL (ref 32.0–36.0)
MCV: 77.2 fL — AB (ref 80.0–100.0)
MONO ABS: 511 {cells}/uL (ref 200–950)
Monocytes Relative: 7 %
NEUTROS PCT: 68 %
Neutro Abs: 4964 cells/uL (ref 1500–7800)
Platelets: 345 10*3/uL (ref 140–400)
RBC: 3.72 MIL/uL — AB (ref 4.20–5.80)
RDW: 17.6 % — ABNORMAL HIGH (ref 11.0–15.0)
WBC: 7.3 10*3/uL (ref 3.8–10.8)

## 2016-01-02 NOTE — Patient Instructions (Signed)
Thank you for coming in today. Continue current treatment.  Get labs today.  Recheck in 1 month.  Return sooner if needed.  You should hear from Hematology soon.  Let me know if you do not hear anything.

## 2016-01-03 ENCOUNTER — Encounter: Payer: Self-pay | Admitting: Family Medicine

## 2016-01-03 LAB — COMPLETE METABOLIC PANEL WITH GFR
ALBUMIN: 3.9 g/dL (ref 3.6–5.1)
ALK PHOS: 45 U/L (ref 40–115)
ALT: 15 U/L (ref 9–46)
AST: 15 U/L (ref 10–35)
BILIRUBIN TOTAL: 0.9 mg/dL (ref 0.2–1.2)
BUN: 16 mg/dL (ref 7–25)
CALCIUM: 8.7 mg/dL (ref 8.6–10.3)
CO2: 25 mmol/L (ref 20–31)
CREATININE: 1.75 mg/dL — AB (ref 0.70–1.33)
Chloride: 103 mmol/L (ref 98–110)
GFR, EST AFRICAN AMERICAN: 51 mL/min — AB (ref >=60)
GFR, Est Non African American: 44 mL/min — ABNORMAL LOW (ref >=60)
Glucose, Bld: 105 mg/dL — ABNORMAL HIGH (ref 65–99)
Potassium: 4.1 mmol/L (ref 3.5–5.3)
Sodium: 139 mmol/L (ref 135–146)
Total Protein: 7 g/dL (ref 6.1–8.1)

## 2016-01-03 NOTE — Progress Notes (Signed)
Isaac Bowen is a 50 y.o. male who presents to [facility] [place] today for follow-up recent hospitalization. As noted below patient has a medical history significant for sickle cell thalassemia. This typically has been well-controlled with essentially no special treatment. However he recently was hospitalized for a pneumonia and subsequently developed sickle cell crisis, acute chest syndrome, and a splenic infarct. He had an extensive workup including cultures and a transesophageal echocardiogram that was all unrevealing. Additionally the hospitalization was complicated by acute exacerbation of stage III chronic kidney disease. Now discharged from the hospital he is feeling a lot better and taking the medications listed below. He would like to return to work tomorrow. He denies any fevers or chills and notes only mild left upper quadrant abdominal pain. Part of the discharge summary the medical team recommended that he follow-up with a hematologist in the future. Additionally they recommend a ten-day course of antibiotics taking Augmentin.   Past Medical History:  Diagnosis Date  . Hypertension    Past Surgical History:  Procedure Laterality Date  . orthoscopic     Social History  Substance Use Topics  . Smoking status: Never Smoker  . Smokeless tobacco: Never Used  . Alcohol use 0.0 oz/week     Comment: 4/week   family history includes Cancer in his mother; Diabetes in his father; Hyperlipidemia in his father; Hypertension in his brother and father; Stroke in his father.  ROS as above:  Medications: Current Outpatient Prescriptions  Medication Sig Dispense Refill  . amLODipine (NORVASC) 10 MG tablet Take 1 tablet (10 mg total) by mouth daily. 90 tablet 1  . amoxicillin-clavulanate (AUGMENTIN) 875-125 MG tablet Take by mouth.    Marland Kitchen HYDROcodone-acetaminophen (NORCO/VICODIN)  5-325 MG tablet Take by mouth.    Marland Kitchen lisinopril-hydrochlorothiazide (PRINZIDE,ZESTORETIC) 20-12.5 MG tablet Take 1 tablet by mouth daily. 90 tablet 1  . sildenafil (REVATIO) 20 MG tablet Take 1 tablet (20 mg total) by mouth as needed. 50 tablet 11   No current facility-administered medications for this visit.    Allergies  Allergen Reactions  . Metoprolol Diarrhea and Other (See Comments)    Chest pain    Health Maintenance Health Maintenance  Topic Date Due  . INFLUENZA VACCINE  10/03/2015  . Fecal DNA (Cologuard)  11/09/2018  . TETANUS/TDAP  03/23/2025  . HIV Screening  Completed     Exam:  BP 118/83   Pulse 77   Wt 218 lb (98.9 kg)   BMI 28.76 kg/m  Gen: Well NAD HEENT: EOMI,  MMM Lungs: Normal work of breathing. CTABL Heart: RRR no MRG Abd: NABS, Soft. Nondistended, Nontender Exts: Brisk capillary refill, warm and well perfused.        Assessment and Plan: 50 y.o. male with  As an hospitalization due to pneumonia with sickle cell crisis, spleen infarct and acute exacerbation of chronic kidney disease.  Allergies doing pretty well. Plan to recheck CBC and CMP. I think it's a good idea to establish with hematology.  Referral ordered. Plan to recheck physical exam and labs today and when a few weeks or sooner if needed.   Orders Placed This Encounter  Procedures  . CBC with Differential/Platelet  . COMPLETE METABOLIC PANEL WITH GFR  . Ambulatory referral to Hematology / Oncology    Referral Priority:   Routine    Referral Type:   Consultation    Referral Reason:   Specialty Services Required    Number  of Visits Requested:   1    Discussed warning signs or symptoms. Please see discharge instructions. Patient expresses understanding.   Appendix: Hospital discharge summary attached below.  Discharge Summary  PCP: No primary care provider on file. Discharge Details   Admit date: 12/24/2015 Discharge date and time: 12/29/15 Hospital LOS:  5 days  Active Hospital Problems  Diagnosis Date Noted POA  . *Splenic infarct 12/24/2015 Yes  . Fever 12/24/2015 Yes  . Acute renal failure superimposed on stage 3 chronic kidney disease (*) 12/24/2015 Yes  . Thrombocytopenia (*) 12/24/2015 Yes  . Hypokalemia 12/24/2015 Yes  . 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 Essential hypertension 03/08/2014 Yes   Resolved Hospital Problems  Diagnosis Date Noted Date Resolved POA  No resolved problems to display.   Current Discharge Medication List   NEW medications  Details  amoxicillin-clavulanate (AUGMENTIN) 875-125 mg per tablet Take one tablet by mouth every 12 (twelve) hours. Start date: 12/29/2015, End date: 01/08/2016   HYDROcodone-acetaminophen (NORCO) 5-325 mg per tablet Take one tablet by mouth every 3 (three) hours as needed. Start date: 12/29/2015, End date: 01/08/2016    CONTINUED medications  Details  amLODIPine besylate (NORVASC) 10 mg tablet Take 10 mg by mouth daily.   lisinopril-hydrochlorothiazide (PRINZIDE,ZESTORETIC) 20-12.5 MG per tablet Take 1 tablet by mouth daily.     Reason for medication changes:see below  Hospital Course   Indication for Admission/chief complaint: splenic infarction  Hospital Course:  Isaac Bowen is a 50 y.o. male with history of HgbS thalassemia trait, hypertension who presented to ED with abdominal pain and fever. He was found to have a small splenic infarct which was responsible for his pain. He was also found to have a pneumonia. He was started on levofloxacin and admitted. He continued to spike fevers. ID was consulted and recommended changing to zosyn and obtaining TTE. The TTE was negative. He eventually defervesced. ID feels his fevers are probably related to sickle cell crisis. They recommend a 14 day course of antibiotics total. He will discharge on augmentin.   Left Upper lope pneumonia: ID on board. He was initially on levofloxacin then broadened to  zosyn. TTE negative for vegetations. He has been afebrile for 48 hours. He will discharge home with 10 more days of augmentin.   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 His pain is controlled with PO lortab. H/H stable. He has been asked to follow up with his PCP in 1-2 weeks. He does not have a hematologist  Thrombocytopenia: stable and somewhat chronic. I suspect he has some splenic sequestration at this time  Acute on chronic kidney disease baseline Cr ~1.4. He is now back to baseline after hydration  Recommendations to physicians/followup needed:   Physical Exam: Vitals:  12/29/15 1134  BP: 129/88  Pulse: 77  Resp: 16  Temp: 98.6 F (37 C)  SpO2: 100%   Constitutional - resting comfortably, no acute distress Eyes - pupils equal round and reactive to light and accomodation, extraocular movements intact Nose - no gross deformity or drainage Mouth - no oral lesions noted Throat - no swelling or erythema  CV - (+)S1S2, no murmurs, no peripheral edema, no JVD  Resp - CTA bilaterally, no wheezing or crackles, no clubbing, cyanosis  GI - (+)BS, soft, very tender to palpation in LUQ, non-distended MSK - ROM normal  Skin - no rashes or wounds Neuro - alert, aware, oriented to person/place/time  Psych - normal affect and mood  Labs on Discharge:   Recent Labs Lab 12/24/15 1426 12/25/15 0021 12/26/15 0641 12/27/15 1041 12/28/15 0340 12/28/15 0950  WBC 6.8 6.4 4.9* 6.3 4.6* 4.4*  HGB 10.4* 9.4* 9.0* 10.1* 8.2* 9.5*  HCT 30.5* 27.2* 26.4* 29.5* 24.4* 27.7*  PLT 61* 89* -- 99* 141* 125*   Recent Labs Lab 12/24/15 1426 12/25/15 0021 12/25/15 1138 12/26/15 0641 12/27/15 1041 12/28/15 0340  NA 138 136 -- 138 135* 136  K 3.3* 3.1* 3.5* 4.0 4.5 3.4*  CL 94* 94* -- 100 97 102  CO2 29 29 -- 26 25 24   BUN 18 16 -- 10 10 12   CREATININE 1.70* 1.50* -- 1.40* 1.30* 1.30*  CALCIUM 8.8 8.4* -- 8.3* 8.8 8.2*   Recent Labs Lab 12/24/15 1426   AST 19  ALT 9  ALKPHOS 54  ALBUMIN 4.3   Recent Labs Lab 12/25/15 0021  HGBA1C <4.2*   No results for input(s): LABPROT, INR, PTT in the last 168 hours. No results for input(s): CHOL, LDL, HDL, TRIG in the last 168 hours. No results for input(s): TROPONIN, CK in the last 168 hours.  Invalid input(s): CK-MB  Diagnostics:  Pertinent Radiological findings (summarize):  CT A/P: IMPRESSION: Splenomegaly with small inferior laceration or infarct and small perisplenic hematoma. No evidence of active extravasation. 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 Cholelithiasis. Cardiac enlargement. Simple cysts in the liver  CT chest: IMPRESSION: 1. Focal left upper lobe airspace disease most consistent with pneumonia. Mild bibasilar scarring 2. Splenomegaly 3. Mild cardiac enlargement 4. Osseous changes of sickle cell anemia  TTE: A complete two-dimensional transthoracic echocardiogram with color flow Doppler and spectral Doppler was performed. The left ventricle is normal in size, wall thickness and wall motion with ejection fraction of 55-60%. The left atrium is mildly dilated. The mitral valve is normal in structure with trace regurgitation. The tricupid valve leaflets are structurally normal with mild (1+) regurgitation. Right ventricular systolic pressure is elevated between 40-45mm Hg, consistent with moderate pulmonary hypertension. The aortic valve is trileaflet with thin, pliable leaflets that move normally.  [place]   Discharge Procedure Orders Regular Diet   Notify Physician for increased abdominal swelling or bloating   Notify Physician for increased shortness of breath   Notify Physician for increased or unrelieved pain   Activity as tolerated   Discharge instructions  Scheduling Instructions: Mr. Lemonte, Kult were admitted to the hospital with abdominal pain. We determined you had a splenic  infarct. This is a small blockage in the blood flow to your spleen. This is because of your sickle cell disease. You also had a fever. We found a small pneumonia. The infectious disease doctors saw you since your continued to have fevers longer than expected. They think the fevers might be from the sickle cell crisis but recommended a total of 14 days of antibiotics. You have 10 more days of augmentin to take. I am also prescribing you some pain medicine to take as needed. Please follow up with your primary care doctor in 1-2 weeks for a hospital follow up.   Potential for Rehab: Good Code Status: Full Code Disposition: Home Consults: ID  Followup appointments: No future appointments.  Time spent in discharge process: total time spent 35 minutes  Electronically signed: Shana Chute, MD 12/29/2015 / 12:42 PM

## 2016-01-05 ENCOUNTER — Inpatient Hospital Stay: Payer: BLUE CROSS/BLUE SHIELD | Admitting: Family Medicine

## 2016-01-10 ENCOUNTER — Ambulatory Visit: Payer: BLUE CROSS/BLUE SHIELD | Admitting: Family Medicine

## 2016-01-29 ENCOUNTER — Ambulatory Visit (HOSPITAL_BASED_OUTPATIENT_CLINIC_OR_DEPARTMENT_OTHER): Payer: BLUE CROSS/BLUE SHIELD | Admitting: Hematology & Oncology

## 2016-01-29 ENCOUNTER — Ambulatory Visit (HOSPITAL_BASED_OUTPATIENT_CLINIC_OR_DEPARTMENT_OTHER): Payer: BLUE CROSS/BLUE SHIELD

## 2016-01-29 ENCOUNTER — Ambulatory Visit: Payer: BLUE CROSS/BLUE SHIELD

## 2016-01-29 ENCOUNTER — Other Ambulatory Visit (HOSPITAL_BASED_OUTPATIENT_CLINIC_OR_DEPARTMENT_OTHER): Payer: BLUE CROSS/BLUE SHIELD

## 2016-01-29 VITALS — BP 137/81 | HR 73 | Temp 98.7°F | Resp 16 | Wt 220.8 lb

## 2016-01-29 DIAGNOSIS — D5740 Sickle-cell thalassemia without crisis: Secondary | ICD-10-CM

## 2016-01-29 DIAGNOSIS — J154 Pneumonia due to other streptococci: Secondary | ICD-10-CM | POA: Diagnosis not present

## 2016-01-29 DIAGNOSIS — Z23 Encounter for immunization: Secondary | ICD-10-CM | POA: Diagnosis not present

## 2016-01-29 DIAGNOSIS — D5720 Sickle-cell/Hb-C disease without crisis: Secondary | ICD-10-CM

## 2016-01-29 LAB — CBC WITH DIFFERENTIAL (CANCER CENTER ONLY)
BASO#: 0 10*3/uL (ref 0.0–0.2)
BASO%: 0.2 % (ref 0.0–2.0)
EOS ABS: 0.1 10*3/uL (ref 0.0–0.5)
EOS%: 1.5 % (ref 0.0–7.0)
HCT: 30.3 % — ABNORMAL LOW (ref 38.7–49.9)
HGB: 10.4 g/dL — ABNORMAL LOW (ref 13.0–17.1)
LYMPH#: 1.7 10*3/uL (ref 0.9–3.3)
LYMPH%: 29.6 % (ref 14.0–48.0)
MCH: 27 pg — AB (ref 28.0–33.4)
MCHC: 34.3 g/dL (ref 32.0–35.9)
MCV: 79 fL — AB (ref 82–98)
MONO#: 0.4 10*3/uL (ref 0.1–0.9)
MONO%: 7.4 % (ref 0.0–13.0)
NEUT#: 3.6 10*3/uL (ref 1.5–6.5)
NEUT%: 61.3 % (ref 40.0–80.0)
RBC: 3.85 10*6/uL — ABNORMAL LOW (ref 4.20–5.70)
RDW: 18.7 % — AB (ref 11.1–15.7)
WBC: 5.8 10*3/uL (ref 4.0–10.0)

## 2016-01-29 LAB — CMP (CANCER CENTER ONLY)
ALT(SGPT): 13 U/L (ref 10–47)
AST: 21 U/L (ref 11–38)
Albumin: 3.5 g/dL (ref 3.3–5.5)
Alkaline Phosphatase: 47 U/L (ref 26–84)
BUN: 13 mg/dL (ref 7–22)
CHLORIDE: 103 meq/L (ref 98–108)
CO2: 28 meq/L (ref 18–33)
Calcium: 8.7 mg/dL (ref 8.0–10.3)
Creat: 1.8 mg/dl — ABNORMAL HIGH (ref 0.6–1.2)
GLUCOSE: 95 mg/dL (ref 73–118)
POTASSIUM: 3.6 meq/L (ref 3.3–4.7)
Sodium: 141 mEq/L (ref 128–145)
Total Bilirubin: 1.1 mg/dl (ref 0.20–1.60)
Total Protein: 6.7 g/dL (ref 6.4–8.1)

## 2016-01-29 LAB — TECHNOLOGIST REVIEW CHCC SATELLITE

## 2016-01-29 MED ORDER — FOLIC ACID 1 MG PO TABS: 2.0000 mg | ORAL_TABLET | Freq: Every day | ORAL | 4 refills | Status: DC

## 2016-01-29 MED ORDER — PNEUMOCOCCAL 13-VAL CONJ VACC IM SUSP
0.5000 mL | Freq: Once | INTRAMUSCULAR | Status: AC
  Administered 2016-01-29: 0.5 mL via INTRAMUSCULAR
  Filled 2016-01-29: qty 0.5

## 2016-01-29 NOTE — Patient Instructions (Signed)
Pneumococcal Conjugate Vaccine suspension for injection What is this medicine? PNEUMOCOCCAL VACCINE (NEU mo KOK al vak SEEN) is a vaccine used to prevent pneumococcus bacterial infections. These bacteria can cause serious infections like pneumonia, meningitis, and blood infections. This vaccine will lower your chance of getting pneumonia. If you do get pneumonia, it can make your symptoms milder and your illness shorter. This vaccine will not treat an infection and will not cause infection. This vaccine is recommended for infants and young children, adults with certain medical conditions, and adults 63 years or older. This medicine may be used for other purposes; ask your health care provider or pharmacist if you have questions. COMMON BRAND NAME(S): Prevnar, Prevnar 13 What should I tell my health care provider before I take this medicine? They need to know if you have any of these conditions: -bleeding problems -fever -immune system problems -an unusual or allergic reaction to pneumococcal vaccine, diphtheria toxoid, other vaccines, latex, other medicines, foods, dyes, or preservatives -pregnant or trying to get pregnant -breast-feeding How should I use this medicine? This vaccine is for injection into a muscle. It is given by a health care professional. A copy of Vaccine Information Statements will be given before each vaccination. Read this sheet carefully each time. The sheet may change frequently. Talk to your pediatrician regarding the use of this medicine in children. While this drug may be prescribed for children as young as 69 weeks old for selected conditions, precautions do apply. Overdosage: If you think you have taken too much of this medicine contact a poison control center or emergency room at once. NOTE: This medicine is only for you. Do not share this medicine with others. What if I miss a dose? It is important not to miss your dose. Call your doctor or health care professional  if you are unable to keep an appointment. What may interact with this medicine? -medicines for cancer chemotherapy -medicines that suppress your immune function -steroid medicines like prednisone or cortisone This list may not describe all possible interactions. Give your health care provider a list of all the medicines, herbs, non-prescription drugs, or dietary supplements you use. Also tell them if you smoke, drink alcohol, or use illegal drugs. Some items may interact with your medicine. What should I watch for while using this medicine? Mild fever and pain should go away in 3 days or less. Report any unusual symptoms to your doctor or health care professional. What side effects may I notice from receiving this medicine? Side effects that you should report to your doctor or health care professional as soon as possible: -allergic reactions like skin rash, itching or hives, swelling of the face, lips, or tongue -breathing problems -confused -fast or irregular heartbeat -fever over 102 degrees F -seizures -unusual bleeding or bruising -unusual muscle weakness Side effects that usually do not require medical attention (report to your doctor or health care professional if they continue or are bothersome): -aches and pains -diarrhea -fever of 102 degrees F or less -headache -irritable -loss of appetite -pain, tender at site where injected -trouble sleeping This list may not describe all possible side effects. Call your doctor for medical advice about side effects. You may report side effects to FDA at 1-800-FDA-1088. Where should I keep my medicine? This does not apply. This vaccine is given in a clinic, pharmacy, doctor's office, or other health care setting and will not be stored at home. NOTE: This sheet is a summary. It may not cover all possible information.  If you have questions about this medicine, talk to your doctor, pharmacist, or health care provider.  2017 [employer]vier/Gold  Standard (2013-11-25 10:27:27)

## 2016-01-29 NOTE — Progress Notes (Signed)
Referral MD  Reason for Referral: Hemoglobin S/Thalassemia B  Chief Complaint  Patient presents with  . New Evaluation  : My doctor told me I needed someone to help me with my sickle cell  HPI: Isaac Bowen is a very nice 50 year old African-American male. He has sickle cell disease. One note says he has thalassemia but I see no other mention of hemoglobin C. We are sending off a hemoglobin electrophoresis on him.  He is originally from [place]. He said his last crisis was back in 1996. He was at [facility] for this.  He apparently had a crisis back in October this year. He went to [facility] emergency room. He was then admitted to [facility]. It does not sound like he was here for all that long. He had a splenic infarct. He has splenomegaly. He was found have pneumonia in the left upper lung. He is on antibiotics for 14 days. He did have an echocardiogram done. He had an ejection fraction of 55-60%. The walls of the heart and his heart valves all looked okay. He had no pericardial effusion. He did have some abdominal x-rays. 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  Because of this, and is felt that he needed hematology to manage him as an outpatient.  Shockingly, no one has put him on folic acid. I'm very surprised by this.  He says his father had sickle cell. He says now his mother had "anemia". He has siblings that are not afflicted with sickle cell. I suppose that have the trait.  He still has his gallbladder in. The only surgery that he had was for a torn bicep muscle and have a network back in 2011.  He moved to the [place] area 2 years ago. His fiance is down here. They will be married in February 2018.  He does not smoke. He enjoys wine on occasion.  He has had no visual problems. He's had no hip problems.  His been no rashes. His had no leg swelling. His had no change in bowel or bladder habits. He already had a colonoscopy this year.  He exercises. He is  very diligent with trying to stay active.  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  He does not have any children. His fiance has children.  Overall, I see that his performance status is ECOG 0.   Past Medical History:  Diagnosis Date  . Hypertension   . 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    :  Past Surgical History:  Procedure Laterality Date  . orthoscopic    :   Current Outpatient Prescriptions:  .  amLODipine (NORVASC) 10 MG tablet, Take 1 tablet (10 mg total) by mouth daily., Disp: 90 tablet, Rfl: 1 .  lisinopril-hydrochlorothiazide (PRINZIDE,ZESTORETIC) 20-12.5 MG tablet, Take 1 tablet by mouth daily., Disp: 90 tablet, Rfl: 1 .  sildenafil (REVATIO) 20 MG tablet, Take 1 tablet (20 mg total) by mouth as needed., Disp: 50 tablet, Rfl: 11:  :  Allergies  Allergen Reactions  . Metoprolol Diarrhea and Other (See Comments)    Chest pain  :  Family History  Problem Relation Age of Onset  . Cancer Mother   . Diabetes Father   . Hyperlipidemia Father   . Hypertension Father   . Stroke Father   . Hypertension Brother   :  Social History   Social History  .  Marital status: Single    Spouse name: N/A  . Number of children: N/A  . Years of education: N/A   Occupational History  . Not on file.   Social History Main Topics  . Smoking status: Never Smoker  . Smokeless tobacco: Never Used  . Alcohol use 0.0 oz/week     Comment: 4/week  . Drug use:     Types: Marijuana  . Sexual activity: Yes    Partners: Female   Other Topics Concern  . Not on file   Social History Narrative  . No narrative on file  :  Pertinent items are noted in HPI.  Exam: @IPVITALS @ Well-developed and well-nourished African-American male in no obvious distress. Vital signs are temperature of 98.7. Pulse 73. Blood pressure 137/81. Weight is 220 pounds.  Head neck exam shows no ocular or oral lesions. He has no scleral icterus. He has no adenopathy in the neck. Thyroid is nonpalpable. Lungs are clear to percussion and auscultation bilaterally. Cardiac exam regular rate and rhythm with no murmurs, rubs or bruits. Abdominal exam is soft. He has good bowel sounds. There is no fluid wave. There is no palpable liver or spleen tip. Back exam shows no tenderness over the spine, ribs or hips. Extremities shows no clubbing, cyanosis or edema. Neurological exam shows no focal neurological deficits. Skin exam shows no rashes, ecchymoses or petechia.    Recent Labs  01/29/16 1314  WBC 5.8  HGB 10.4*  HCT 30.3*  PLT 73 Few Large & Occ giant platelets*    Recent Labs  01/29/16 1314  NA 141  K 3.6  CL 103  CO2 28  GLUCOSE 95  BUN 13  CREATININE 1.8*  CALCIUM 8.7    Blood smear review:  Mild anisocytosis and poikilocytosis. I saw several target cells. I saw very few sickle cells. I saw no inclusion bodies. There was no rouleau formation. I saw no nucleated red blood cells. White cells per normal morphology and maturation. There are no nucleated red blood cells. There are no teardrop cells. Platelets are adequate in number and size.  Pathology: None      Assessment and Plan: Isaac Bowen is a very nice 50 year old African American male. He has a sickle variant. I'm not sure which one he has. 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 On the x-rays from [facility], he had the avascular necrosis which can be seen with hemoglobin [place]a disease. Again, we will see what his hemoglobin electrophoresis shows.  Again I am shocked that he is not on folic acid. I will get him on 2 mg of folic acid. I believe that this is vitally important for him.  We will see what his iron levels are. I cannot imagine that he is iron overloaded since he really has not been transfused. I think he was transfused when he is hospitalized recently.  I think our goal is to  try to prevent crises. Folic acid should help with this. He should be well hydrated. I told him not to get dehydrated when he exercises.  I also want to make sure that his hemoglobin does not get too high. Sometimes with sickle variant, the hemoglobin can get a little on the high side and this can lead to crises.  I'm not sure as to why his platelet count is decreased. I suppose this might be from splenic sequestration. We will have to watch this.  I think we can probably get him back here in 3 months. I  think this would be reasonable. He is going on a cruise for his wedding and honeymoon. He is looking for it to this. I told that he can always call us if he has any problems. I really want to make sure that he is healthy so he can enjoy his wedding and honeymoon.  He also got the pneumonia vaccine-Prevnar 13. He's never had one before.  I spent about 45 minutes with him today. I answered all his questions. He obviously is very knowledgeable about his disease.

## 2016-01-30 ENCOUNTER — Ambulatory Visit: Payer: BLUE CROSS/BLUE SHIELD | Admitting: Family Medicine

## 2016-01-30 LAB — IRON AND TIBC
%SAT: 46 % (ref 20–55)
Iron: 96 ug/dL (ref 42–163)
TIBC: 209 ug/dL (ref 202–409)
UIBC: 112 ug/dL — ABNORMAL LOW (ref 117–376)

## 2016-01-30 LAB — FERRITIN: Ferritin: 164 ng/ml (ref 22–316)

## 2016-01-30 LAB — RETICULOCYTES: RETICULOCYTE COUNT: 3.7 % — AB (ref 0.6–2.6)

## 2016-02-01 LAB — HEMOGLOBINOPATHY EVALUATION
HEMOGLOBIN F QUANTITATION: 5.7 % — AB (ref 0.0–2.0)
HGB C: 0 %
HGB S: 69.4 % — ABNORMAL HIGH
Hemoglobin A2 Quantitation: 7.3 % — ABNORMAL HIGH (ref 0.7–3.1)
Hgb A: 17.6 % — ABNORMAL LOW (ref 94.0–98.0)

## 2016-04-26 ENCOUNTER — Other Ambulatory Visit: Payer: Self-pay | Admitting: *Deleted

## 2016-04-29 ENCOUNTER — Other Ambulatory Visit: Payer: BLUE CROSS/BLUE SHIELD

## 2016-04-29 ENCOUNTER — Ambulatory Visit: Payer: BLUE CROSS/BLUE SHIELD | Admitting: Hematology & Oncology

## 2016-04-29 ENCOUNTER — Other Ambulatory Visit: Payer: Self-pay

## 2016-04-29 DIAGNOSIS — D5740 Sickle-cell thalassemia without crisis: Secondary | ICD-10-CM

## 2016-05-03 ENCOUNTER — Other Ambulatory Visit: Payer: Self-pay | Admitting: Family Medicine

## 2016-05-05 ENCOUNTER — Other Ambulatory Visit: Payer: Self-pay | Admitting: Family Medicine

## 2016-05-05 DIAGNOSIS — I10 Essential (primary) hypertension: Secondary | ICD-10-CM

## 2016-05-08 ENCOUNTER — Ambulatory Visit (HOSPITAL_BASED_OUTPATIENT_CLINIC_OR_DEPARTMENT_OTHER): Payer: BLUE CROSS/BLUE SHIELD | Admitting: Hematology & Oncology

## 2016-05-08 ENCOUNTER — Other Ambulatory Visit (HOSPITAL_BASED_OUTPATIENT_CLINIC_OR_DEPARTMENT_OTHER): Payer: BLUE CROSS/BLUE SHIELD

## 2016-05-08 VITALS — BP 139/76 | HR 60 | Temp 98.0°F | Resp 20 | Wt 223.0 lb

## 2016-05-08 DIAGNOSIS — D5740 Sickle-cell thalassemia without crisis: Secondary | ICD-10-CM

## 2016-05-08 LAB — COMPREHENSIVE METABOLIC PANEL
ALT: 16 U/L (ref 0–55)
AST: 16 U/L (ref 5–34)
Albumin: 4.1 g/dL (ref 3.5–5.0)
Alkaline Phosphatase: 81 U/L (ref 40–150)
Anion Gap: 9 mEq/L (ref 3–11)
BILIRUBIN TOTAL: 1.23 mg/dL — AB (ref 0.20–1.20)
BUN: 15.3 mg/dL (ref 7.0–26.0)
CO2: 26 meq/L (ref 22–29)
CREATININE: 1.4 mg/dL — AB (ref 0.7–1.3)
Calcium: 9.1 mg/dL (ref 8.4–10.4)
Chloride: 106 mEq/L (ref 98–109)
EGFR: 66 mL/min/{1.73_m2} — ABNORMAL LOW (ref >90)
GLUCOSE: 86 mg/dL (ref 70–140)
Potassium: 3.7 mEq/L (ref 3.5–5.1)
SODIUM: 141 meq/L (ref 136–145)
TOTAL PROTEIN: 7.4 g/dL (ref 6.4–8.3)

## 2016-05-08 LAB — CBC WITH DIFFERENTIAL (CANCER CENTER ONLY)
BASO#: 0 10*3/uL (ref 0.0–0.2)
BASO%: 0.2 % (ref 0.0–2.0)
EOS%: 1.4 % (ref 0.0–7.0)
Eosinophils Absolute: 0.1 10*3/uL (ref 0.0–0.5)
HCT: 33.6 % — ABNORMAL LOW (ref 38.7–49.9)
HGB: 11.6 g/dL — ABNORMAL LOW (ref 13.0–17.1)
LYMPH#: 1.5 10*3/uL (ref 0.9–3.3)
LYMPH%: 29.8 % (ref 14.0–48.0)
MCH: 27.7 pg — ABNORMAL LOW (ref 28.0–33.4)
MCHC: 34.5 g/dL (ref 32.0–35.9)
MCV: 80 fL — ABNORMAL LOW (ref 82–98)
MONO#: 0.3 10*3/uL (ref 0.1–0.9)
MONO%: 6.8 % (ref 0.0–13.0)
NEUT#: 3.1 10*3/uL (ref 1.5–6.5)
NEUT%: 61.8 % (ref 40.0–80.0)
PLATELETS: 143 10*3/uL — AB (ref 145–400)
RBC: 4.19 10*6/uL — AB (ref 4.20–5.70)
RDW: 16.9 % — ABNORMAL HIGH (ref 11.1–15.7)
WBC: 5 10*3/uL (ref 4.0–10.0)

## 2016-05-08 LAB — IRON AND TIBC
%SAT: 31 % (ref 20–55)
Iron: 75 ug/dL (ref 42–163)
TIBC: 238 ug/dL (ref 202–409)
UIBC: 163 ug/dL (ref 117–376)

## 2016-05-08 LAB — FERRITIN: FERRITIN: 128 ng/mL (ref 22–316)

## 2016-05-08 LAB — TECHNOLOGIST REVIEW CHCC SATELLITE

## 2016-05-08 NOTE — Progress Notes (Signed)
Dictation #1 [id number]  [id number]

## 2016-05-08 NOTE — Progress Notes (Signed)
Hematology and Oncology Follow Up Visit  Isaac Bowen [id number] [birth year] 51 y.o. 05/08/2016   Principle Diagnosis:   Sickle cell/beta thalassemia  Current Therapy:    Folic acid 2 mg by mouth daily     Interim History:  Isaac Bowen is back for follow-up. This is his second office visit. We first saw him back in November. At that time, we did get a hemoglobin electrophoresis on him. 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 His hemoglobin A2 percentage is 7.3. His hemoglobin S percentage was 69%. He had 6% hemoglobin F.  He really has had very little problems with the hemoglobinopathy. He did have a splenic infarct last year. This seems to be improving.  It is his birthday today. He and his f[place] [facility] [place] the weekend.  He says once he started the folic acid, he began to feel a little bit better.  He did not complain of any abdominal pain. He's had no change in bowel or bladder habits. He's had no joint issues. He's had no myalgias.  He has had no infections. He's got through the influenza season without any problems.  Overall, his performance status is ECOG 1.  Medications:  Current Outpatient Prescriptions:  .  amLODipine (NORVASC) 10 MG tablet, TAKE ONE TABLET BY MOUTH ONCE DAILY, Disp: 30 tablet, Rfl: 0 .  folic acid (FOLVITE) 1 MG tablet, Take 2 tablets (2 mg total) by mouth daily., Disp: 180 tablet, Rfl: 4 .  lisinopril-hydrochlorothiazide (PRINZIDE,ZESTORETIC) 20-12.5 MG tablet, Take 1 tablet by mouth daily. Needs appointment for f/u, Disp: 30 tablet, Rfl: 0 .  sildenafil (REVATIO) 20 MG tablet, TAKE 1-5 TABLETS BY MOUTH AS NEEDED FOR SEXUAL ACTIVITY, Disp: 50 tablet, Rfl: 11  Allergies:  Allergies  Allergen Reactions  . Metoprolol Diarrhea and Other (See Comments)    Chest pain    Past Medical History, Surgical history, Social history, and Family History were reviewed and updated.  Review of Systems: As  above  Physical Exam:  weight is 223 lb (101.2 kg). His oral temperature is 98 F (36.7 C). His blood pressure is 139/76 and his pulse is 60. His respiration is 20.   Wt Readings from Last 3 Encounters:  05/08/16 223 lb (101.2 kg)  01/29/16 220 lb 12.8 oz (100.2 kg)  01/02/16 218 lb (98.9 kg)     Well-developed and well-nourished African-American male. Head and neck exam shows no ocular or oral lesions. There are no palpable cervical or supraclavicular lymph nodes. Lungs are clear. Cardiac exam regular rate and rhythm with no murmurs, rubs or bruits. Abdomen is soft. He has good bowel sounds. There is no fluid wave. There might be some slight guarding in the left upper quadrant. I cannot palpate his spleen tip. There is no hepatomegaly. Extremities shows no clubbing, cyanosis or edema. Neurological exam shows no focal neurological deficits. Skin exam shows no rashes, ecchymoses or petechia.  Lab Results  Component Value Date   WBC 5.0 05/08/2016   HGB 11.6 (L) 05/08/2016   HCT 33.6 (L) 05/08/2016   MCV 80 (L) 05/08/2016   PLT 143 (L) 05/08/2016     Chemistry      Component Value Date/Time   NA 141 01/29/2016 1314   K 3.6 01/29/2016 1314   CL 103 01/29/2016 1314   CO2 28 01/29/2016 1314   BUN 13 01/29/2016 1314   CREATININE 1.8 (H) 01/29/2016 1314      Component Value  Date/Time   CALCIUM 8.7 01/29/2016 1314   ALKPHOS 47 01/29/2016 1314   AST 21 01/29/2016 1314   ALT 13 01/29/2016 1314   BILITOT 1.10 01/29/2016 1314         Impression and Plan: Isaac Bowen is a 51 year old African-American male. Again, today is his birthday. We will continue to follow him along. I think as long as he is on folic acid he should do okay.  I don't see anything that would indicate a crisis.  There is no issues with iron overload. We saw him in November, his ferritin was 164 with iron saturation of 46%.  I will plan to get him back in 4 months. I think this would be a reasonable time for  follow-up. He certainly can come back sooner if he has any problems.     Volanda Napoleon, MD 3/7/201810:22 AM

## 2016-05-09 LAB — RETICULOCYTES: RETICULOCYTE COUNT: 3.3 % — AB (ref 0.6–2.6)

## 2016-06-11 ENCOUNTER — Other Ambulatory Visit: Payer: Self-pay | Admitting: Family Medicine

## 2016-06-11 DIAGNOSIS — I10 Essential (primary) hypertension: Secondary | ICD-10-CM

## 2016-06-26 ENCOUNTER — Other Ambulatory Visit: Payer: Self-pay | Admitting: *Deleted

## 2016-06-26 DIAGNOSIS — D5740 Sickle-cell thalassemia without crisis: Secondary | ICD-10-CM

## 2016-06-26 DIAGNOSIS — J154 Pneumonia due to other streptococci: Secondary | ICD-10-CM

## 2016-06-26 MED ORDER — FOLIC ACID 1 MG PO TABS: 2.0000 mg | ORAL_TABLET | Freq: Every day | ORAL | 4 refills | Status: DC

## 2016-07-11 ENCOUNTER — Ambulatory Visit (INDEPENDENT_AMBULATORY_CARE_PROVIDER_SITE_OTHER): Payer: BLUE CROSS/BLUE SHIELD | Admitting: Family Medicine

## 2016-07-11 ENCOUNTER — Encounter: Payer: Self-pay | Admitting: Family Medicine

## 2016-07-11 VITALS — BP 124/86 | HR 60 | Ht 73.0 in | Wt 225.0 lb

## 2016-07-11 DIAGNOSIS — R29898 Other symptoms and signs involving the musculoskeletal system: Secondary | ICD-10-CM

## 2016-07-11 DIAGNOSIS — Z0000 Encounter for general adult medical examination without abnormal findings: Secondary | ICD-10-CM | POA: Diagnosis not present

## 2016-07-11 DIAGNOSIS — I10 Essential (primary) hypertension: Secondary | ICD-10-CM | POA: Diagnosis not present

## 2016-07-11 DIAGNOSIS — Z125 Encounter for screening for malignant neoplasm of prostate: Secondary | ICD-10-CM | POA: Diagnosis not present

## 2016-07-11 MED ORDER — AMLODIPINE BESYLATE 10 MG PO TABS: 10.0000 mg | ORAL_TABLET | Freq: Every day | ORAL | 1 refills | Status: DC

## 2016-07-11 MED ORDER — AMLODIPINE BESYLATE 10 MG PO TABS: 10.0000 mg | ORAL_TABLET | Freq: Every day | ORAL | 0 refills | Status: DC

## 2016-07-11 MED ORDER — LISINOPRIL-HYDROCHLOROTHIAZIDE 20-12.5 MG PO TABS: ORAL_TABLET | ORAL | 1 refills | Status: DC

## 2016-07-11 MED ORDER — LISINOPRIL-HYDROCHLOROTHIAZIDE 20-12.5 MG PO TABS: ORAL_TABLET | ORAL | 0 refills | Status: DC

## 2016-07-11 NOTE — Progress Notes (Signed)
Isaac Bowen is a 51 y.o. male who presents to [facility] today for well adult visit.   Mr. Stuard is doing well. He notes left shoulder weakness but otherwise feels well. No chest pain palpitations or shortness of breath. He gets recent lab checks at his oncologist office to follow along his sickle-cell trait and thalassemia trait.  He does note however left shoulder weakness. This is been ongoing now for about a year and a half. He's been told previously that was rotator cuff related. He notes significant atrophy of the left deltoid muscle associated with weakness with overhead lifting. He is not doing any exercises for it. He denies neck pain or radiating pain or numbness.   Past Medical History:  Diagnosis Date  . Hypertension   . 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     Past Surgical History:  Procedure Laterality Date  . orthoscopic     Social History  Substance Use Topics  . Smoking status: Never Smoker  . Smokeless tobacco: Never Used  . Alcohol use 0.0 oz/week     Comment: 4/week   family history includes Cancer in his mother; Diabetes in his father; Hyperlipidemia in his father; Hypertension in his brother and father; Stroke in his father.  ROS as above:  Medications: Current Outpatient Prescriptions  Medication Sig Dispense Refill  . amLODipine (NORVASC) 10 MG tablet Take 1 tablet (10 mg total) by mouth daily. 90 tablet 1  . amLODipine (NORVASC) 10 MG tablet Take 1 tablet (10 mg total) by mouth daily. 30 tablet 0  . folic acid (FOLVITE) 1 MG tablet Take 2 tablets (2 mg total) by mouth daily. 180 tablet 4  . lisinopril-hydrochlorothiazide (PRINZIDE,ZESTORETIC) 20-12.5 MG tablet TAKE 1 TABLET BY MOUTH ONCE DAILY 90 tablet 1  .  lisinopril-hydrochlorothiazide (PRINZIDE,ZESTORETIC) 20-12.5 MG tablet TAKE 1 TABLET BY MOUTH ONCE DAILY NEEDS  APPOINTMENT 30 tablet 0  . sildenafil (REVATIO) 20 MG tablet TAKE 1-5 TABLETS BY MOUTH AS NEEDED FOR SEXUAL ACTIVITY 50 tablet 11   No current facility-administered medications for this visit.    Allergies  Allergen Reactions  . Metoprolol Diarrhea and Other (See Comments)    Chest pain    Health Maintenance Health Maintenance  Topic Date Due  . INFLUENZA VACCINE  01/28/2017 (Originally 10/02/2016)  . Fecal DNA (Cologuard)  11/09/2018  . TETANUS/TDAP  03/23/2025  . HIV Screening  Completed     Exam:  BP 124/86   Pulse 60   Ht 6\' 1"  (1.854 m)   Wt 225 lb (102.1 kg)   BMI 29.69 kg/m  Gen: Well NAD HEENT: EOMI,  MMM Lungs: Normal work of breathing. CTABL Heart: RRR no MRG Abd: NABS, Soft. Nondistended, Nontender Exts: Brisk capillary refill, warm and well perfused.  MSK: Left shoulder significant decreased bulk in the left deltoid. Range of motion is intact over patient has significant weakness to external rotation and abduction. Upper extremity strength and sensation is otherwise normal.          No results found for this or any previous visit (from the past 72 hour(s)). No results found.    Assessment and Plan: 51 y.o. male with  Well adult: Doing well and on lipid panel and PSA to labs existing with oncology. Work on adequate sleep and exercise and a good diet.  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 Plan for nerve conduction study and follow-up after the study has been obtained.   Orders Placed This Encounter  Procedures  . Lipid Panel w/reflex Direct LDL  . PSA  . NCV with EMG(electromyography)    Standing Status:   Future    Standing Expiration Date:   07/11/2017    Scheduling Instructions:     Dr Kellie Moor at  [facility] Specific Question:   Where should this test be performed?    Answer:   other   Meds ordered this encounter  Medications  . amLODipine (NORVASC) 10 MG tablet    Sig: Take 1 tablet (10 mg total) by mouth daily.    Dispense:  90 tablet    Refill:  1    Please consider 90 day supplies to promote better adherence  . lisinopril-hydrochlorothiazide (PRINZIDE,ZESTORETIC) 20-12.5 MG tablet    Sig: TAKE 1 TABLET BY MOUTH ONCE DAILY    Dispense:  90 tablet    Refill:  1    Please consider 90 day supplies to promote better adherence  . amLODipine (NORVASC) 10 MG tablet    Sig: Take 1 tablet (10 mg total) by mouth daily.    Dispense:  30 tablet    Refill:  0    Please consider 90 day supplies to promote better adherence  . lisinopril-hydrochlorothiazide (PRINZIDE,ZESTORETIC) 20-12.5 MG tablet    Sig: TAKE 1 TABLET BY MOUTH ONCE DAILY NEEDS  APPOINTMENT    Dispense:  30 tablet    Refill:  0    Please consider 90 day supplies to promote better adherence     Discussed warning signs or symptoms. Please see discharge instructions. Patient expresses understanding.

## 2016-07-11 NOTE — Patient Instructions (Signed)
Thank you for coming in today. Get fasting labs in the near future with Dr Hansel Starling labs.  You should hear about the nerve test soon.  Recheck in 1-3 months about the nerve tests

## 2016-09-11 ENCOUNTER — Ambulatory Visit: Payer: BLUE CROSS/BLUE SHIELD | Admitting: Hematology & Oncology

## 2016-09-11 ENCOUNTER — Other Ambulatory Visit: Payer: BLUE CROSS/BLUE SHIELD

## 2016-10-03 ENCOUNTER — Ambulatory Visit (HOSPITAL_BASED_OUTPATIENT_CLINIC_OR_DEPARTMENT_OTHER): Payer: BLUE CROSS/BLUE SHIELD | Admitting: Hematology & Oncology

## 2016-10-03 ENCOUNTER — Other Ambulatory Visit (HOSPITAL_BASED_OUTPATIENT_CLINIC_OR_DEPARTMENT_OTHER): Payer: BLUE CROSS/BLUE SHIELD

## 2016-10-03 VITALS — BP 132/80 | HR 58 | Temp 98.6°F | Resp 17 | Wt 219.0 lb

## 2016-10-03 DIAGNOSIS — D5740 Sickle-cell thalassemia without crisis: Secondary | ICD-10-CM | POA: Diagnosis not present

## 2016-10-03 LAB — COMPREHENSIVE METABOLIC PANEL
ALBUMIN: 4 g/dL (ref 3.5–5.0)
ALK PHOS: 57 U/L (ref 40–150)
ALT: 13 U/L (ref 0–55)
ANION GAP: 9 meq/L (ref 3–11)
AST: 19 U/L (ref 5–34)
BILIRUBIN TOTAL: 1.99 mg/dL — AB (ref 0.20–1.20)
BUN: 13.9 mg/dL (ref 7.0–26.0)
CO2: 26 meq/L (ref 22–29)
CREATININE: 1.4 mg/dL — AB (ref 0.7–1.3)
Calcium: 8.7 mg/dL (ref 8.4–10.4)
Chloride: 105 mEq/L (ref 98–109)
EGFR: 66 mL/min/{1.73_m2} — AB (ref >90)
GLUCOSE: 93 mg/dL (ref 70–140)
Potassium: 3.4 mEq/L — ABNORMAL LOW (ref 3.5–5.1)
Sodium: 139 mEq/L (ref 136–145)
TOTAL PROTEIN: 6.8 g/dL (ref 6.4–8.3)

## 2016-10-03 LAB — IRON AND TIBC
%SAT: 29 % (ref 20–55)
IRON: 68 ug/dL (ref 42–163)
TIBC: 238 ug/dL (ref 202–409)
UIBC: 169 ug/dL (ref 117–376)

## 2016-10-03 LAB — CBC WITH DIFFERENTIAL (CANCER CENTER ONLY)
BASO#: 0 10*3/uL (ref 0.0–0.2)
BASO%: 0 % (ref 0.0–2.0)
EOS ABS: 0.1 10*3/uL (ref 0.0–0.5)
EOS%: 1.2 % (ref 0.0–7.0)
HCT: 33.4 % — ABNORMAL LOW (ref 38.7–49.9)
HEMOGLOBIN: 11.8 g/dL — AB (ref 13.0–17.1)
LYMPH#: 1.4 10*3/uL (ref 0.9–3.3)
LYMPH%: 28.4 % (ref 14.0–48.0)
MCH: 28.9 pg (ref 28.0–33.4)
MCHC: 35.3 g/dL (ref 32.0–35.9)
MCV: 82 fL (ref 82–98)
MONO#: 0.4 10*3/uL (ref 0.1–0.9)
MONO%: 7.2 % (ref 0.0–13.0)
NEUT#: 3.1 10*3/uL (ref 1.5–6.5)
NEUT%: 63.2 % (ref 40.0–80.0)
Platelets: 123 10*3/uL — ABNORMAL LOW (ref 145–400)
RBC: 4.09 10*6/uL — ABNORMAL LOW (ref 4.20–5.70)
RDW: 14.9 % (ref 11.1–15.7)
WBC: 4.8 10*3/uL (ref 4.0–10.0)

## 2016-10-03 LAB — TECHNOLOGIST REVIEW CHCC SATELLITE

## 2016-10-03 LAB — LACTATE DEHYDROGENASE: LDH: 225 U/L (ref 125–245)

## 2016-10-03 LAB — FERRITIN: FERRITIN: 128 ng/mL (ref 22–316)

## 2016-10-03 NOTE — Progress Notes (Signed)
Hematology and Oncology Follow Up Visit  Isaac Bowen [id number] [birth year] 51 y.o. 10/03/2016   Principle Diagnosis:   Sickle cell/beta thalassemia  Current Therapy:    Folic acid 2 mg by mouth daily     Interim History:  Isaac Bowen is back for follow-up. He is doing pretty well. He just [place] from the mountains. He and his fiance where they're. They go to [facility] and gamble.  He is going to have his wedding in December 2019.  He is having no problems with his folic acid.  He's had no bony pain. There's been no cough or shortness of breath. He has had no nausea or vomiting. He has had no change in bowel or bladder habits. He has had no leg swelling   Overall, his performance status is ECOG 1.  Medications:  Current Outpatient Prescriptions:  .  amLODipine (NORVASC) 10 MG tablet, Take 1 tablet (10 mg total) by mouth daily., Disp: 90 tablet, Rfl: 1 .  amLODipine (NORVASC) 10 MG tablet, Take 1 tablet (10 mg total) by mouth daily., Disp: 30 tablet, Rfl: 0 .  folic acid (FOLVITE) 1 MG tablet, Take 2 tablets (2 mg total) by mouth daily., Disp: 180 tablet, Rfl: 4 .  lisinopril-hydrochlorothiazide (PRINZIDE,ZESTORETIC) 20-12.5 MG tablet, TAKE 1 TABLET BY MOUTH ONCE DAILY, Disp: 90 tablet, Rfl: 1 .  lisinopril-hydrochlorothiazide (PRINZIDE,ZESTORETIC) 20-12.5 MG tablet, TAKE 1 TABLET BY MOUTH ONCE DAILY NEEDS  APPOINTMENT, Disp: 30 tablet, Rfl: 0 .  sildenafil (REVATIO) 20 MG tablet, TAKE 1-5 TABLETS BY MOUTH AS NEEDED FOR SEXUAL ACTIVITY, Disp: 50 tablet, Rfl: 11  Allergies:  Allergies  Allergen Reactions  . Metoprolol Diarrhea and Other (See Comments)    Chest pain    Past Medical History, Surgical history, Social history, and Family History were reviewed and updated.  Review of Systems: As above  Physical Exam:  weight is 219 lb (99.3 kg). His oral temperature is 98.6 F (37 C). His blood pressure is 132/80 and his pulse is 58 (abnormal). His respiration is 17 and oxygen  saturation is 99%.   Wt Readings from Last 3 Encounters:  10/03/16 219 lb (99.3 kg)  07/11/16 225 lb (102.1 kg)  05/08/16 223 lb (101.2 kg)     Well-developed and well-nourished African-American male. Head and neck exam shows no ocular or oral lesions. There are no palpable cervical or supraclavicular lymph nodes. Lungs are clear. Cardiac exam regular rate and rhythm with no murmurs, rubs or bruits. Abdomen is soft. He has good bowel sounds. There is no fluid wave. There might be some slight guarding in the left upper quadrant. I cannot palpate his spleen tip. There is no hepatomegaly. Extremities shows no clubbing, cyanosis or edema. Neurological exam shows no focal neurological deficits. Skin exam shows no rashes, ecchymoses or petechia.  Lab Results  Component Value Date   WBC 4.8 10/03/2016   HGB 11.8 (L) 10/03/2016   HCT 33.4 (L) 10/03/2016   MCV 82 10/03/2016   PLT 123 (L) 10/03/2016     Chemistry      Component Value Date/Time   NA 141 05/08/2016 0758   K 3.7 05/08/2016 0758   CL 103 01/29/2016 1314   CO2 26 05/08/2016 0758   BUN 15.3 05/08/2016 0758   CREATININE 1.4 (H) 05/08/2016 0758      Component Value Date/Time   CALCIUM 9.1 05/08/2016 0758   ALKPHOS 81 05/08/2016 0758   AST 16 05/08/2016 0758   ALT 16 05/08/2016 0758   BILITOT  1.23 (H) 05/08/2016 0758         Impression and Plan: Mr. Millhouse is a 51 year old African-American male. From my point of view, he is doing quite well.  I think we can get him back in 6 months. I don't see any problems that we have to take care of before then.  It is always fun to talk with him.    Volanda Napoleon, MD 8/2/20188:42 AM

## 2016-10-04 LAB — RETICULOCYTES: RETICULOCYTE COUNT: 3.5 % — AB (ref 0.6–2.6)

## 2016-10-07 LAB — HEMOGLOBINOPATHY EVALUATION
HEMOGLOBIN A2 QUANTITATION: 7.2 % — AB (ref 1.8–3.2)
HGB C: 0 %
HGB S: 68.6 % — ABNORMAL HIGH
HGB VARIANT: 0 %
Hemoglobin F Quantitation: 7.5 % — ABNORMAL HIGH (ref 0.0–2.0)
Hgb A: 16.7 % — ABNORMAL LOW (ref 96.4–98.8)

## 2016-11-28 ENCOUNTER — Encounter: Payer: Self-pay | Admitting: Family Medicine

## 2016-11-28 ENCOUNTER — Ambulatory Visit (INDEPENDENT_AMBULATORY_CARE_PROVIDER_SITE_OTHER): Payer: BLUE CROSS/BLUE SHIELD | Admitting: Family Medicine

## 2016-11-28 VITALS — BP 166/86 | HR 59 | Wt 217.0 lb

## 2016-11-28 DIAGNOSIS — Z23 Encounter for immunization: Secondary | ICD-10-CM | POA: Diagnosis not present

## 2016-11-28 DIAGNOSIS — I10 Essential (primary) hypertension: Secondary | ICD-10-CM

## 2016-11-28 DIAGNOSIS — R29898 Other symptoms and signs involving the musculoskeletal system: Secondary | ICD-10-CM | POA: Diagnosis not present

## 2016-11-28 MED ORDER — LISINOPRIL-HYDROCHLOROTHIAZIDE 20-12.5 MG PO TABS: ORAL_TABLET | ORAL | 2 refills | Status: DC

## 2016-11-28 MED ORDER — AMLODIPINE BESYLATE 10 MG PO TABS: 10.0000 mg | ORAL_TABLET | Freq: Every day | ORAL | 2 refills | Status: DC

## 2016-11-28 NOTE — Progress Notes (Signed)
Isaac Bowen is a 51 y.o. male who presents to [facility]: [place] today for follow-up of hypertension and shoulder deformity.  Hypertension: Patient reports good adherence to medications until last week when he ran out. Patient would like to switch to 30-day prescriptions to better keep track of when he needs to refill. He has not had any trouble with lightheadedness, dizziness, syncope, or headache. Patient was seen in August at Oncology clinic and found to have a blood pressure of 132/80 at that time.   Shoulder deformity: Patient reports noticing a progressive decrease in muscle bulk on the anterior portion of his left deltoid for about 1 year. He endorses occasional numbness and tingling radiating from the upper deltoid to the middle of the upper arm. He denies any recent injury or inciting event. Patient reports dislocating the shoulder as a teenage. He has noticed some progressive weakness over the past year, but this has not limited his ability to perform any of his daily activities. Patient has not noticed any issues with range of motion.    Past Medical History:  Diagnosis Date  . Hypertension   . 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     Past Surgical History:  Procedure Laterality Date  . orthoscopic     Social History  Substance Use Topics  . Smoking status: Never Smoker  . Smokeless tobacco: Never Used  . Alcohol use 0.0 oz/week     Comment: 4/week   family history includes Cancer in his mother; Diabetes in his father; Hyperlipidemia in his father; Hypertension in his brother and father; Stroke in his father.  ROS as above:  Medications: Current Outpatient Prescriptions  Medication Sig Dispense Refill  . amLODipine (NORVASC) 10 MG tablet Take 1  tablet (10 mg total) by mouth daily. 30 tablet 2  . folic acid (FOLVITE) 1 MG tablet Take 2 tablets (2 mg total) by mouth daily. 180 tablet 4  . lisinopril-hydrochlorothiazide (PRINZIDE,ZESTORETIC) 20-12.5 MG tablet TAKE 1 TABLET BY MOUTH ONCE DAILY 30 tablet 2  . sildenafil (REVATIO) 20 MG tablet TAKE 1-5 TABLETS BY MOUTH AS NEEDED FOR SEXUAL ACTIVITY 50 tablet 11   No current facility-administered medications for this visit.    Allergies  Allergen Reactions  . Metoprolol Diarrhea and Other (See Comments)    Chest pain    Health Maintenance Health Maintenance  Topic Date Due  . INFLUENZA VACCINE  01/28/2017 (Originally 10/02/2016)  . Fecal DNA (Cologuard)  11/09/2018  . TETANUS/TDAP  03/23/2025  . HIV Screening  Completed     Exam:  BP (!) 166/86   Pulse (!) 59   Wt 217 lb (98.4 kg)   BMI 28.63 kg/m  Gen: Well NAD, sitting comfortably in chair HEENT: EOMI,  MMM Lungs: Normal work of breathing. CTABL, no wheezes, rales, or rhonchi Heart: RRR, normal S1 and S2, no MRG Abd: NABS, Soft. Nondistended, Nontender Exts: Brisk capillary refill, warm and well perfused.  MSK: Left shoulder: Obvious atrophy in anterior head of deltoid, no swelling or bruising present No tenderness to palpation Range of motion is full with flexion, extension, abduction and adduction Strength is decreased with flexion, abduction is normal compared to right   No results found for this or any previous visit (from the past 72 hour(s)). No results found.    Assessment and Plan: 51 y.o. male presenting for follow-up  of hypertension and shoulder deformity. Hypertension is well-controlled with current medication regimen. Blood pressure was elevated in clinic today due to him running out of medication. Patient was advised to use a pillbox to better keep track of medication and contact clinic if he needs a refill of medications.   Patient was previously evaluated for shoulder atrophy. 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 Patient has been referred to undergo a nerve conduction study. He was instructed to call clinic if there are any additional problems in scheduling this appointment.    Orders Placed This Encounter  Procedures  . NCV with EMG(electromyography)    Standing Status:   Future    Standing Expiration Date:   11/28/2017    Scheduling Instructions:     Eval left should weakness.      Suspect Axial nerve injury or injury to the brachial plexus    Order Specific Question:   Where should this test be performed?    Answer:   [facility]   Meds ordered this encounter  Medications  . amLODipine (NORVASC) 10 MG tablet    Sig: Take 1 tablet (10 mg total) by mouth daily.    Dispense:  30 tablet    Refill:  2  . lisinopril-hydrochlorothiazide (PRINZIDE,ZESTORETIC) 20-12.5 MG tablet    Sig: TAKE 1 TABLET BY MOUTH ONCE DAILY    Dispense:  30 tablet    Refill:  2     Discussed warning signs or symptoms. Please see discharge instructions. Patient expresses understanding.

## 2016-11-28 NOTE — Patient Instructions (Signed)
Thank you for coming in today. You should hear form [facility] about the nerve study.  Let me know if you cant get it.  Continue the blood pressure medicine.  Recheck with me in about 3 months or sooner if needed.

## 2016-12-17 ENCOUNTER — Ambulatory Visit (INDEPENDENT_AMBULATORY_CARE_PROVIDER_SITE_OTHER): Payer: Self-pay | Admitting: Neurology

## 2016-12-17 ENCOUNTER — Ambulatory Visit (INDEPENDENT_AMBULATORY_CARE_PROVIDER_SITE_OTHER): Payer: BLUE CROSS/BLUE SHIELD | Admitting: Neurology

## 2016-12-17 ENCOUNTER — Encounter: Payer: Self-pay | Admitting: Neurology

## 2016-12-17 DIAGNOSIS — R29898 Other symptoms and signs involving the musculoskeletal system: Secondary | ICD-10-CM

## 2016-12-17 NOTE — Progress Notes (Signed)
Please refer EMG and nerve conduction study procedure note. 

## 2016-12-17 NOTE — Procedures (Signed)
     HISTORY:  Isaac Bowen is a 51 year old gentleman with a one-year history of some discomfort and numbness of the left shoulder associated with weakness. The weakness has persisted. He is being evaluated for this issue.  NERVE CONDUCTION STUDIES:  Nerve conduction studies were performed on the left upper extremity. The distal motor latencies and motor amplitudes for the median and ulnar nerves were within normal limits. The distal motor latency for the radial nerve was prolonged, but with a normal motor amplitude. The nerve conduction velocities for this nerve were normal. The F wave latencies and nerve conduction velocities for the median and ulnar nerves were also normal. The sensory latencies for the median, radial, and ulnar nerves were normal.   EMG STUDIES:  EMG study was performed on the left upper extremity:  The first dorsal interosseous muscle reveals 2 to 4 K units with full recruitment. No fibrillations or positive waves were noted. The abductor pollicis brevis muscle reveals 2 to 4 K units with full recruitment. No fibrillations or positive waves were noted. The extensor indicis proprius muscle reveals 1 to 3 K units with full recruitment. No fibrillations or positive waves were noted. The pronator teres muscle reveals 2 to 6 K units with decreased recruitment. No fibrillations or positive waves were noted. The biceps muscle reveals 1 to 4 K units with decreased recruitment. No fibrillations or positive waves were noted. Complex repetitive discharges were seen. The triceps muscle reveals 2 to 4 K units with full recruitment. No fibrillations or positive waves were noted. The anterior deltoid muscle reveals 1 to 3 K units with markedly reduced recruitment. 2+ fibrillations and positive waves were noted. The supraspinatus muscle revealed 2 to 6 K units with decreased recruitment. No fibrillations or positive waves were seen. Complex repetitive discharges were seen. The cervical  paraspinal muscles were tested at 2 levels. No abnormalities of insertional activity were seen at either level tested. There was good relaxation.   IMPRESSION:  Nerve conduction studies were done on the left upper extremity. These studies were unremarkable exception of a prolongation of the radial nerve distal motor latency. EMG evaluation of the left upper extremity shows evidence of a primarily chronic severe right C5 radiculopathy with some involvement of the C6 level as well. The possibility of an upper trunk brachial plexopathy cannot be fully eliminated by this study.  Jill Alexanders MD 12/17/2016 1:49 PM  [facility] [address] [place], [place] [address]  Phone [phone] Fax [phone]

## 2016-12-18 NOTE — Progress Notes (Addendum)
      [place]    Nerve / Sites Muscle Latency Ref. Amplitude Ref. Rel Amp Segments Distance Velocity Ref. Area    ms ms mV mV %  cm m/s m/s mVms  L Median - APB     Wrist APB 3.0 ?4.4 13.0 ?4.0 100 Wrist - APB 7   54.6     Upper arm APB 7.6  10.9  83.6 Upper arm - Wrist 25 55 ?49 48.3  L Ulnar - ADM     Wrist ADM 2.4 ?3.3 10.5 ?6.0 100 Wrist - ADM 7   49.4     B.Elbow ADM 6.3  9.3  89 B.Elbow - Wrist 22 56 ?49 46.5     A.Elbow ADM 8.0  9.4  101 A.Elbow - B.Elbow 10 58 ?49 46.1         A.Elbow - Wrist      L Radial - EIP     Forearm EIP 4.7 ?2.9 17.6 ?2.0 100 Forearm - EIP 4  ?49 99.0     Elbow EIP 6.6  17.0  96.9 Elbow - Forearm 18 96  96.7     Spiral Gr EIP 9.3  15.6  91.8 Spiral Gr - Elbow 32 118  92.2           SNC    Nerve / Sites Rec. Site Peak Lat Ref.  Amp Ref. Segments Distance    ms ms V V  cm  L Radial - Anatomical snuff box (Forearm)     Forearm Wrist 1.8 ?2.9 18 ?15 Forearm - Wrist 10  L Median - Orthodromic (Dig II, Mid palm)     Dig II Wrist 3.7 ?3.4 32 ?10 Dig II - Wrist 13  L Ulnar - Orthodromic, (Dig V, Mid palm)     Dig V Wrist 3.2 ?3.1 26 ?5 Dig V - Wrist 77           F  Wave    Nerve F Lat Ref.   ms ms  L Median - APB 28.8 ?31.0  L Ulnar - ADM 28.0 ?32.0         EMG full       EMG Summary Table    Spontaneous MUAP Recruitment  Muscle IA Fib PSW Fasc Other Amp Dur. Poly Pattern  R. Abductor digiti minimi (manus) Normal None None None _______ Normal Normal Normal Normal

## 2016-12-19 ENCOUNTER — Telehealth: Payer: Self-pay | Admitting: Family Medicine

## 2016-12-19 DIAGNOSIS — R29898 Other symptoms and signs involving the musculoskeletal system: Secondary | ICD-10-CM

## 2016-12-19 NOTE — Telephone Encounter (Signed)
MRI c-spine ordered. 

## 2016-12-19 NOTE — Telephone Encounter (Signed)
-----   Message from Georgiann Mccoy, [place] sent at 12/18/2016  7:14 AM EDT ----- Notified pt. He would like to proceed with the MRI.

## 2016-12-30 ENCOUNTER — Ambulatory Visit (INDEPENDENT_AMBULATORY_CARE_PROVIDER_SITE_OTHER): Payer: BLUE CROSS/BLUE SHIELD

## 2016-12-30 DIAGNOSIS — M47892 Other spondylosis, cervical region: Secondary | ICD-10-CM

## 2016-12-30 DIAGNOSIS — D170 Benign lipomatous neoplasm of skin and subcutaneous tissue of head, face and neck: Secondary | ICD-10-CM | POA: Diagnosis not present

## 2016-12-30 DIAGNOSIS — R29898 Other symptoms and signs involving the musculoskeletal system: Secondary | ICD-10-CM

## 2016-12-30 DIAGNOSIS — M4802 Spinal stenosis, cervical region: Secondary | ICD-10-CM | POA: Diagnosis not present

## 2017-01-01 ENCOUNTER — Encounter: Payer: Self-pay | Admitting: Family Medicine

## 2017-01-01 ENCOUNTER — Ambulatory Visit (INDEPENDENT_AMBULATORY_CARE_PROVIDER_SITE_OTHER): Payer: BLUE CROSS/BLUE SHIELD | Admitting: Family Medicine

## 2017-01-01 VITALS — BP 128/77 | HR 82

## 2017-01-01 DIAGNOSIS — N183 Chronic kidney disease, stage 3 unspecified: Secondary | ICD-10-CM

## 2017-01-01 DIAGNOSIS — R29898 Other symptoms and signs involving the musculoskeletal system: Secondary | ICD-10-CM | POA: Diagnosis not present

## 2017-01-01 LAB — BASIC METABOLIC PANEL WITH GFR
BUN / CREAT RATIO: 11 (calc) (ref 6–22)
BUN: 15 mg/dL (ref 7–25)
CO2: 29 mmol/L (ref 20–32)
CREATININE: 1.36 mg/dL — AB (ref 0.70–1.33)
Calcium: 9.1 mg/dL (ref 8.6–10.3)
Chloride: 105 mmol/L (ref 98–110)
GFR, EST NON AFRICAN AMERICAN: 60 mL/min/{1.73_m2} (ref >=60)
GFR, Est African American: 69 mL/min/{1.73_m2} (ref >=60)
GLUCOSE: 81 mg/dL (ref 65–99)
POTASSIUM: 3.6 mmol/L (ref 3.5–5.3)
SODIUM: 142 mmol/L (ref 135–146)

## 2017-01-01 NOTE — Patient Instructions (Signed)
Thank you for coming in today. You should hear about MRI soon.  Let me know if you do not hear anything.  Get labs today.  Recheck after MRI.

## 2017-01-02 NOTE — Progress Notes (Signed)
Isaac Bowen is a 51 y.o. male who presents to [facility] today for left shoulder weakness. Patient was seen previously and found to have persistent significant weakness and atrophy of the left shoulder. A nerve conduction study surprisingly showed chronic severe C5 and some C6 radiculopathy. In response to those tasks a MRI was obtained of the cervical spine. Patient is here today for follow-up. He denies significant radiating pain to his right arm or significant radiating pain even to the left upper extremity..    Past Medical History:  Diagnosis Date  . Hypertension   . 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     Past Surgical History:  Procedure Laterality Date  . orthoscopic     Social History  Substance Use Topics  . Smoking status: Never Smoker  . Smokeless tobacco: Never Used  . Alcohol use 0.0 oz/week     Comment: 4/week     ROS:  As above   Medications: Current Outpatient Prescriptions  Medication Sig Dispense Refill  . amLODipine (NORVASC) 10 MG tablet Take 1 tablet (10 mg total) by mouth daily. 30 tablet 2  . folic acid (FOLVITE) 1 MG tablet Take 2 tablets (2 mg total) by mouth daily. 180 tablet 4  . lisinopril-hydrochlorothiazide (PRINZIDE,ZESTORETIC) 20-12.5 MG tablet TAKE 1 TABLET BY MOUTH ONCE DAILY 30 tablet 2  . sildenafil (REVATIO) 20 MG tablet TAKE 1-5 TABLETS BY MOUTH AS NEEDED FOR SEXUAL ACTIVITY 50 tablet 11   No current facility-administered medications for this visit.    Allergies  Allergen Reactions  . Metoprolol Diarrhea and Other (See Comments)    Chest pain     Exam:  BP 128/77   Pulse 82  General: Well Developed, well nourished, and in no acute distress.  Neuro/Psych: Alert and oriented x3, extra-ocular muscles intact, able to move all 4 extremities, sensation grossly  intact. Skin: Warm and dry, no rashes noted.  Respiratory: Not using accessory muscles, speaking in full sentences, trachea midline.  Cardiovascular: Pulses palpable, no extremity edema. Abdomen: Does not appear distended. MSK: Left shoulder considerable atrophy about the deltoid with decreased abduction strength.     Results for orders placed or performed in visit on 01/01/17 (from the past 48 hour(s))  BASIC METABOLIC PANEL WITH GFR     Status: Abnormal   Collection Time: 01/01/17  2:22 PM  Result Value Ref Range   Glucose, Bld 81 65 - 99 mg/dL    Comment: .            Fasting reference interval .    BUN 15 7 - 25 mg/dL   Creat 1.36 (H) 0.70 - 1.33 mg/dL    Comment: For patients >46 years of age, the reference limit for Creatinine is approximately 13% higher for people identified as African-American. .    GFR, Est Non African American 60 > OR = 60 mL/min/1.29m2   GFR, Est African American 69 > OR = 60 mL/min/1.81m2   BUN/Creatinine Ratio 11 6 - 22 (calc)   Sodium 142 135 - 146 mmol/L   Potassium 3.6 3.5 - 5.3 mmol/L   Chloride 105 98 - 110 mmol/L   CO2 29 20 - 32 mmol/L   Calcium 9.1 8.6 - 10.3 mg/dL   Mr Cervical Spine Wo Contrast  Result Date: 12/30/2016 CLINICAL DATA:  Right shoulder weakness.  Right C5 radiculopathy EXAM: MRI CERVICAL SPINE WITHOUT  CONTRAST TECHNIQUE: Multiplanar, multisequence MR imaging of the cervical spine was performed. No intravenous contrast was administered. COMPARISON:  None. FINDINGS: Alignment: Slight retrolisthesis at C4-5. Remaining alignment normal Vertebrae: Negative for fracture or mass. Cord: Normal spinal cord signal.  No cord compression. Posterior Fossa, vertebral arteries, paraspinal tissues: Lipoma in the right posterior neck in the subcutaneous fat measures 5.5 x 2.6 x 4.4 cm. 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 Correlate with any associated symptoms or change in size. Otherwise negative soft tissues Disc levels: C2-3:  Negative  C3-4: Mild disc degeneration and mild uncinate spurring right greater than left. No significant stenosis. C4-5: Moderate disc degeneration and diffuse uncinate spurring bilaterally. Cord flattening. Moderate spinal stenosis and moderate foraminal stenosis bilaterally C5-6: Disc degeneration and spondylosis with diffuse uncinate spurring. Mild spinal stenosis. Moderate foraminal stenosis bilaterally. C6-7: Disc degeneration. Diffuse disc bulging with endplate spurring. Moderate foraminal stenosis bilaterally. C7-T1:  Negative IMPRESSION: Cervical spondylosis at multiple levels. Moderate spinal stenosis and moderate foraminal stenosis bilaterally C4-5 Mild spinal stenosis and moderate foraminal stenosis bilaterally at C5-6 Moderate foraminal stenosis bilaterally C6-7 5.5 x 2.6 x 4.4 cm lipoma in the subcutaneous fat of the right posterior neck. Electronically Signed   By: Franchot Gallo M.D.   On: 12/30/2016 13:53   Nerve Conduction Study  IMPRESSION:  Nerve conduction studies were done on the left upper extremity. These studies were unremarkable exception of a prolongation of the radial nerve distal motor latency. EMG evaluation of the left upper extremity shows evidence of a primarily chronic severe right C5 radiculopathy with some involvement of the C6 level as well. The possibility of an upper trunk brachial plexopathy cannot be fully eliminated by this study.  Jill Alexanders MD 12/17/2016 1:49 PM   Assessment and Plan: 51 y.o. male with  Left shoulder weakness unclear etiology. Patient has neuro impingement and stenosis seen on cervical MRI however it does not look severe enough on the left side to explain the degree of atrophy and the degree of abnormality seen on nerve conduction study. Additionally he does not have contralateral symptoms which would expect based on the appearance of the MRI.  After discussion with the patient I suspect that the injury or problem lies in the brachial  plexus.  Plan for brachial plexus MRI in the near future follow-up after results.    Orders Placed This Encounter  Procedures  . MR SHOULDER LEFT W WO CONTRAST    Standing Status:   Future    Standing Expiration Date:   03/03/2018    Scheduling Instructions:     MRI shoulder to evaluate the brachial plexus.      Please do the brachial plexus protocol.     If you have questions call triage [phone] and ask for Dr Georgina Snell or call my cell phone [phone]    Order Specific Question:   If indicated for the ordered procedure, I authorize the administration of contrast media per Radiology protocol    Answer:   Yes    Order Specific Question:   What is the patient's sedation requirement?    Answer:   No Sedation    Order Specific Question:   Does the patient have a pacemaker or implanted devices?    Answer:   No    Order Specific Question:   Radiology Contrast Protocol - do NOT remove file path    Answer:   [email or web address]    Order Specific Question:   Preferred imaging location?  Answer:   Product/process development scientist (table limit-350lbs)  . BASIC METABOLIC PANEL WITH GFR   No orders of the defined types were placed in this encounter.   Discussed warning signs or symptoms. Please see discharge instructions. Patient expresses understanding.  I spent 25 minutes with this patient, greater than 50% was face-to-face time counseling regarding ddx and treatment options.

## 2017-01-20 ENCOUNTER — Ambulatory Visit (INDEPENDENT_AMBULATORY_CARE_PROVIDER_SITE_OTHER): Payer: BLUE CROSS/BLUE SHIELD

## 2017-01-20 DIAGNOSIS — N183 Chronic kidney disease, stage 3 unspecified: Secondary | ICD-10-CM

## 2017-01-20 DIAGNOSIS — D170 Benign lipomatous neoplasm of skin and subcutaneous tissue of head, face and neck: Secondary | ICD-10-CM | POA: Diagnosis not present

## 2017-01-20 DIAGNOSIS — R29898 Other symptoms and signs involving the musculoskeletal system: Secondary | ICD-10-CM | POA: Diagnosis not present

## 2017-01-20 MED ORDER — GADOBENATE DIMEGLUMINE 529 MG/ML IV SOLN
20.0000 mL | Freq: Once | INTRAVENOUS | Status: AC | PRN
  Administered 2017-01-20: 20 mL via INTRAVENOUS

## 2017-02-27 ENCOUNTER — Ambulatory Visit (INDEPENDENT_AMBULATORY_CARE_PROVIDER_SITE_OTHER): Payer: BLUE CROSS/BLUE SHIELD | Admitting: Family Medicine

## 2017-02-27 ENCOUNTER — Encounter: Payer: Self-pay | Admitting: Family Medicine

## 2017-02-27 VITALS — BP 126/80 | HR 74 | Wt 226.0 lb

## 2017-02-27 DIAGNOSIS — I10 Essential (primary) hypertension: Secondary | ICD-10-CM | POA: Diagnosis not present

## 2017-02-27 DIAGNOSIS — R29898 Other symptoms and signs involving the musculoskeletal system: Secondary | ICD-10-CM | POA: Diagnosis not present

## 2017-02-27 MED ORDER — LISINOPRIL-HYDROCHLOROTHIAZIDE 20-12.5 MG PO TABS: ORAL_TABLET | ORAL | 2 refills | Status: DC

## 2017-02-27 MED ORDER — AMLODIPINE BESYLATE 10 MG PO TABS: 10.0000 mg | ORAL_TABLET | Freq: Every day | ORAL | 2 refills | Status: DC

## 2017-02-27 NOTE — Progress Notes (Signed)
Leif Loflin is a 51 y.o. male who presents to [facility] today for hypertension and shoulder weakness.   Kamdin takes amlodipine and lisinopril/hydrochlorothiazide listed below.  He tolerates this medication well with no chest pain palpitations lightheadedness weakness or numbness.  Left Shoulder Weakness: Jairen has had an extensive workup of left shoulder weakness and muscle atrophy.  He had an MRI of his cervical spine, MRI of his brachial plexus and a nerve conduction study.  He notes that his weakness is still present and slightly worse especially with arm abduction.  He denies any numbness or significant pain.   Past Medical History:  Diagnosis Date  . Hypertension   . 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     Past Surgical History:  Procedure Laterality Date  . orthoscopic     Social History   Tobacco Use  . Smoking status: Never Smoker  . Smokeless tobacco: Never Used  Substance Use Topics  . Alcohol use: Yes    Alcohol/week: 0.0 oz    Comment: 4/week   family history includes Cancer in his mother; Diabetes in his father; Hyperlipidemia in his father; Hypertension in his brother and father; Stroke in his father.  ROS as above:  Medications: Current Outpatient Medications  Medication Sig Dispense Refill  . amLODipine (NORVASC) 10 MG tablet Take 1 tablet (10 mg total) by mouth daily. 90 tablet 2  . folic acid (FOLVITE) 1 MG tablet Take 2 tablets (2 mg total) by mouth daily. 180 tablet 4  . lisinopril-hydrochlorothiazide (PRINZIDE,ZESTORETIC) 20-12.5 MG tablet TAKE 1 TABLET BY MOUTH ONCE DAILY 90 tablet 2  . sildenafil (REVATIO) 20 MG tablet TAKE 1-5 TABLETS BY MOUTH AS NEEDED FOR SEXUAL ACTIVITY 50 tablet 11   No current  facility-administered medications for this visit.    Allergies  Allergen Reactions  . Metoprolol Diarrhea and Other (See Comments)    Chest pain    Health Maintenance Health Maintenance  Topic Date Due  . Fecal DNA (Cologuard)  11/09/2018  . TETANUS/TDAP  03/23/2025  . INFLUENZA VACCINE  Completed  . HIV Screening  Completed     Exam:  BP 126/80   Pulse 74   Wt 226 lb (102.5 kg)   BMI 29.82 kg/m  Gen: Well NAD HEENT: EOMI,  MMM Lungs: Normal work of breathing. CTABL Heart: RRR no MRG Abd: NABS, Soft. Nondistended, Nontender Exts: Brisk capillary refill, warm and well perfused.  Left shoulder: Significant atrophy of the deltoid muscle.  Strength is decreased to shoulder abduction.     EXAM: MRI CERVICAL SPINE WITHOUT CONTRAST  TECHNIQUE: Multiplanar, multisequence MR imaging of the cervical spine was performed. No intravenous contrast was administered.  COMPARISON:  None.  FINDINGS: Alignment: Slight retrolisthesis at C4-5. Remaining alignment normal  Vertebrae: Negative for fracture or mass.  Cord: Normal spinal cord signal.  No cord compression.  Posterior Fossa, vertebral arteries, paraspinal tissues: Lipoma in the right posterior neck in the subcutaneous fat measures 5.5 x 2.6 x 4.4 cm. 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 Correlate with any associated symptoms or change in size. Otherwise negative soft tissues  Disc levels:  C2-3:  Negative  C3-4: Mild disc degeneration and mild uncinate spurring right greater than left. No significant stenosis.  C4-5: Moderate disc degeneration and diffuse uncinate spurring bilaterally. Cord flattening. Moderate spinal stenosis and moderate foraminal  stenosis bilaterally  C5-6: Disc degeneration and spondylosis with diffuse uncinate spurring. Mild spinal stenosis. Moderate foraminal stenosis bilaterally.  C6-7: Disc degeneration. Diffuse disc bulging with endplate spurring. Moderate foraminal  stenosis bilaterally.  C7-T1:  Negative  IMPRESSION: Cervical spondylosis at multiple levels.  Moderate spinal stenosis and moderate foraminal stenosis bilaterally C4-5  Mild spinal stenosis and moderate foraminal stenosis bilaterally at C5-6  Moderate foraminal stenosis bilaterally C6-7  5.5 x 2.6 x 4.4 cm lipoma in the subcutaneous fat of the right posterior neck.   Electronically Signed   By: Franchot Gallo M.D.   On: 12/30/2016 13:53  CLINICAL DATA:  Left shoulder weakness with associated muscle atrophy for approximately 1 year. No known injury.  EXAM: MRI BRACHIAL PLEXUS WITHOUT AND WITH CONTRAST  TECHNIQUE: Multiplanar, multiecho pulse sequences of the neck and surrounding structures were obtained with an without intravenous contrast. The field of view was focused on the leftbrachial plexus from the neural foramina to the axilla.  CONTRAST:  20 ml MULTIHANCE GADOBENATE DIMEGLUMINE 529 MG/ML IV SOLN  COMPARISON:  MRI cervical spine 12/30/2016.  FINDINGS: Spinal cord  Demonstrates normal signal. Please see report of dedicated cervical spine MRI for description of cervical spondylosis.  Brachial plexus:  Appears normal. No fluid collection or mass impinging on the brachial plexus is identified. No lesion within the brachial plexus itself is seen.  Muscles and tendons  There is mild fatty atrophy of the teres minor muscle. Musculature otherwise appears preserved.  Bones  Extensive abnormal signal is seen in the left humeral head with an appearance most consistent with avascular necrosis. This finding is incompletely imaged.  Joints  Appear normal.  Other findings  A cystic lesion measuring 3.2 cm craniocaudal by 0.9 cm AP by 1.8 cm transverse is seen just anterior and slightly superior to the thyroid cartilage. This lesion does not enhance. The patient also has a lipoma in the subcutaneous fatty tissues of the neck  which measures 4.8 cm craniocaudal by 2.9 cm AP by 5.3 cm transverse.  IMPRESSION: Normal-appearing brachial plexus.  Mild appearing fatty atrophy of the left teres minor as can be seen in quadrilateral space syndrome. The quadrilateral space is not included on this exam.  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 Dedicated left shoulder MRI without contrast is recommended for further evaluation of this finding and mild fatty atrophy of the teres minor.  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 No complicating feature.  Simple lipoma subcutaneous fatty tissues of the neck.   Electronically Signed   By: Inge Rise M.D.   On: 01/20/2017 11:36  HISTORY:  Lawerence Dery is a 51 year old gentleman with a one-year history of some discomfort and numbness of the left shoulder associated with weakness. The weakness has persisted. He is being evaluated for this issue.  NERVE CONDUCTION STUDIES:  Nerve conduction studies were performed on the left upper extremity. The distal motor latencies and motor amplitudes for the median and ulnar nerves were within normal limits. The distal motor latency for the radial nerve was prolonged, but with a normal motor amplitude. The nerve conduction velocities for this nerve were normal. The F wave latencies and nerve conduction velocities for the median and ulnar nerves were also normal. The sensory latencies for the median, radial, and ulnar nerves were normal.   EMG STUDIES:  EMG study was performed on the left upper extremity:  The first dorsal interosseous muscle  reveals 2 to 4 K units with full recruitment. No fibrillations or positive waves were noted. The abductor pollicis brevis muscle reveals 2 to 4 K units with full recruitment. No fibrillations or positive waves were noted. The extensor indicis proprius muscle reveals 1 to 3 K units  with full recruitment. No fibrillations or positive waves were noted. The pronator teres muscle reveals 2 to 6 K units with decreased recruitment. No fibrillations or positive waves were noted. The biceps muscle reveals 1 to 4 K units with decreased recruitment. No fibrillations or positive waves were noted. Complex repetitive discharges were seen. The triceps muscle reveals 2 to 4 K units with full recruitment. No fibrillations or positive waves were noted. The anterior deltoid muscle reveals 1 to 3 K units with markedly reduced recruitment. 2+ fibrillations and positive waves were noted. The supraspinatus muscle revealed 2 to 6 K units with decreased recruitment. No fibrillations or positive waves were seen. Complex repetitive discharges were seen. The cervical paraspinal muscles were tested at 2 levels. No abnormalities of insertional activity were seen at either level tested. There was good relaxation.   IMPRESSION:  Nerve conduction studies were done on the left upper extremity. These studies were unremarkable exception of a prolongation of the radial nerve distal motor latency. EMG evaluation of the left upper extremity shows evidence of a primarily chronic severe right C5 radiculopathy with some involvement of the C6 level as well. The possibility of an upper trunk brachial plexopathy cannot be fully eliminated by this study.  Jill Alexanders MD 12/17/2016 1:49 PM  No results found for this or any previous visit (from the past 72 hour(s)). No results found.    Assessment and Plan: 51 y.o. male with  HTN/CKD: Doing well continue current regimen recheck labs every 6 months.  Left Shoulder Atrophy: Concerning for C5 nerve root involvement.  Alternate diagnosis is Axillary nerve injury. However this diagnosis is less likely as the nerve conduction study did not show involvement of the this nerve.  No evidence of brachial plexus injury as seen on MRI Brachial Plexus.   At this point  I think C-spine involvement is most likely. I think a referral to neurosurgery at this point is warranted.     Orders Placed This Encounter  Procedures  . Ambulatory referral to Neurosurgery    Referral Priority:   Routine    Referral Type:   Surgical    Referral Reason:   Specialty Services Required    Requested Specialty:   Neurosurgery    Number of Visits Requested:   1   Meds ordered this encounter  Medications  . amLODipine (NORVASC) 10 MG tablet    Sig: Take 1 tablet (10 mg total) by mouth daily.    Dispense:  90 tablet    Refill:  2  . lisinopril-hydrochlorothiazide (PRINZIDE,ZESTORETIC) 20-12.5 MG tablet    Sig: TAKE 1 TABLET BY MOUTH ONCE DAILY    Dispense:  90 tablet    Refill:  2     Discussed warning signs or symptoms. Please see discharge instructions. Patient expresses understanding.  I spent 25 minutes with this patient, greater than 50% was face-to-face time counseling regarding ddx and treatment plan.

## 2017-02-27 NOTE — Patient Instructions (Signed)
Thank you for coming in today. You should hear from [facility] soon.  Let me know if you do not hear anything.  Recheck with me for blood pressure and kidneys in May of 2019 Return sooner if needed.

## 2017-03-26 ENCOUNTER — Telehealth: Payer: Self-pay | Admitting: Family Medicine

## 2017-03-26 DIAGNOSIS — I10 Essential (primary) hypertension: Secondary | ICD-10-CM

## 2017-03-26 MED ORDER — LISINOPRIL-HYDROCHLOROTHIAZIDE 20-12.5 MG PO TABS: ORAL_TABLET | ORAL | 0 refills | Status: DC

## 2017-03-26 MED ORDER — AMLODIPINE BESYLATE 10 MG PO TABS: 10.0000 mg | ORAL_TABLET | Freq: Every day | ORAL | 0 refills | Status: DC

## 2017-03-26 NOTE — Telephone Encounter (Signed)
Medication sent. Left message advising patient of refill.

## 2017-03-26 NOTE — Telephone Encounter (Signed)
Pt called. He is needing a refill on Lisinopril and Amlodipine. He uses mail order and they are shipping meds out today but it will be another 5-7 days before he gets them. He wants [place] to give him a refill on meds in the meantime/ send to [place] at [facility].

## 2017-04-07 ENCOUNTER — Inpatient Hospital Stay: Payer: BLUE CROSS/BLUE SHIELD | Admitting: Hematology & Oncology

## 2017-04-07 ENCOUNTER — Inpatient Hospital Stay: Payer: BLUE CROSS/BLUE SHIELD | Attending: Hematology & Oncology

## 2017-04-30 ENCOUNTER — Other Ambulatory Visit: Payer: Self-pay | Admitting: Family Medicine

## 2017-04-30 DIAGNOSIS — I10 Essential (primary) hypertension: Secondary | ICD-10-CM

## 2017-06-02 ENCOUNTER — Other Ambulatory Visit: Payer: Self-pay | Admitting: Family Medicine

## 2017-06-02 DIAGNOSIS — I10 Essential (primary) hypertension: Secondary | ICD-10-CM

## 2017-06-27 ENCOUNTER — Emergency Department
Admission: EM | Admit: 2017-06-27 | Discharge: 2017-06-27 | Disposition: A | Discharge status: Home / Self Care | Payer: BLUE CROSS/BLUE SHIELD | Source: Home / Self Care | Attending: Family Medicine | Admitting: Family Medicine

## 2017-06-27 ENCOUNTER — Emergency Department (INDEPENDENT_AMBULATORY_CARE_PROVIDER_SITE_OTHER): Payer: BLUE CROSS/BLUE SHIELD

## 2017-06-27 ENCOUNTER — Encounter: Payer: Self-pay | Admitting: Emergency Medicine

## 2017-06-27 DIAGNOSIS — N433 Hydrocele, unspecified: Secondary | ICD-10-CM

## 2017-06-27 DIAGNOSIS — I861 Scrotal varices: Secondary | ICD-10-CM

## 2017-06-27 DIAGNOSIS — N50812 Left testicular pain: Secondary | ICD-10-CM

## 2017-06-27 DIAGNOSIS — N453 Epididymo-orchitis: Secondary | ICD-10-CM

## 2017-06-27 LAB — POCT CBC W AUTO DIFF (K'VILLE URGENT CARE)

## 2017-06-27 LAB — POCT URINALYSIS DIP (MANUAL ENTRY)
BILIRUBIN UA: NEGATIVE
GLUCOSE UA: NEGATIVE mg/dL
Ketones, POC UA: NEGATIVE mg/dL
Nitrite, UA: NEGATIVE
Protein Ur, POC: 100 mg/dL — AB
SPEC GRAV UA: 1.01 (ref 1.010–1.025)
Urobilinogen, UA: 0.2 E.U./dL
pH, UA: 5.5 (ref 5.0–8.0)

## 2017-06-27 NOTE — ED Triage Notes (Signed)
Pt c/o left testicle pain starting wed am, yesterday pain became increasingly worse and noticed swelling. States temp was 101 last night. Denies meds today/.

## 2017-06-27 NOTE — ED Provider Notes (Signed)
[facility]    CSN: [id number] Arrival date & time: 06/27/17  0920     History   Chief Complaint Chief Complaint  Patient presents with  . Testicle Pain    HPI Isaac Bowen is a 52 y.o. male.   Patient developed soreness in his left testicle 3 days ago after work.  He recalls lifting some boxes about 4 hours earlier, although they were not heavy.  The pain in his testicle has gradually increased, now radiating to his left lower abdomen.  Yesterday he developed increased swelling of his left testicle with fever to 100.1 and chills/sweats.  He denies urethral discharge, and no urinary symptoms.  No concern for STD.  The history is provided by the patient.  Testicle Pain  This is a new problem. Episode onset: 3 days ago. The problem occurs constantly. The problem has been gradually worsening. Associated symptoms include abdominal pain. Nothing aggravates the symptoms. Nothing relieves the symptoms. He has tried nothing for the symptoms.    Past Medical History:  Diagnosis Date  . Hypertension   . 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      Patient Active Problem List   Diagnosis Date Noted  . Shoulder weakness 07/11/2016  . Acute renal failure superimposed on stage 3 chronic kidney disease ([place]) 12/24/2015  . Hypokalemia 12/24/2015  . Splenic infarct 12/24/2015  . Thrombocytopenia ([place]) 12/24/2015  . Vitamin D deficiency 10/12/2015  . CKD (chronic kidney disease), stage III ([place]) 03/27/2015  . Numbness 09/21/2014  . ED (erectile dysfunction) 09/21/2014  . Essential (primary) hypertension 03/08/2014  . Family history of diabetes mellitus in first degree relative 03/08/2014  . 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 orthoscopic         Home Medications    Prior to  Admission medications   Medication Sig Start Date End Date Taking? Authorizing Provider  amLODipine (NORVASC) 10 MG tablet TAKE 1 TABLET BY MOUTH ONCE DAILY 06/02/17   Gregor Hams, MD  folic acid (FOLVITE) 1 MG tablet Take 2 tablets (2 mg total) by mouth daily. 06/26/16   Volanda Napoleon, MD  lisinopril-hydrochlorothiazide (PRINZIDE,ZESTORETIC) 20-12.5 MG tablet TAKE 1 TABLET BY MOUTH ONCE DAILY 06/02/17   Gregor Hams, MD  sildenafil (REVATIO) 20 MG tablet TAKE 1-5 TABLETS BY MOUTH AS NEEDED FOR SEXUAL ACTIVITY 05/06/16   Gregor Hams, MD    Family History Family History  Problem Relation Age of Onset  . Cancer Mother   . Diabetes Father   . Hyperlipidemia Father   . Hypertension Father   . Stroke Father   . Hypertension Brother     Social History Social History   Tobacco Use  . Smoking status: Never Smoker  . Smokeless tobacco: Never Used  Substance Use Topics  . Alcohol use: Yes    Alcohol/week: 0.0 oz    Comment: 4/week  . Drug use: Yes    Types: Marijuana     Allergies   Metoprolol   Review of Systems Review of Systems  Constitutional: Positive for activity change, chills, diaphoresis, fatigue and fever.  HENT: Negative.   Eyes: Negative.   Respiratory: Negative.   Cardiovascular: Negative.   Gastrointestinal: Positive for abdominal pain.  Genitourinary: Positive for scrotal swelling and testicular pain. Negative for difficulty urinating, discharge, dysuria, flank pain, frequency, hematuria, penile  swelling and urgency.  Musculoskeletal: Negative.      Physical Exam Triage Vital Signs ED Triage Vitals  Enc Vitals Group     BP 06/27/17 1017 (!) 154/80     Pulse Rate 06/27/17 1017 76     Resp --      Temp 06/27/17 1017 98.9 F (37.2 C)     Temp Source 06/27/17 1017 Oral     SpO2 06/27/17 1017 100 %     Weight 06/27/17 1018 224 lb (101.6 kg)     Height --      Head Circumference --      Peak Flow --      Pain Score 06/27/17 1018 7     Pain Loc --       Pain Edu? --      Excl. in [place]? --    No data found.  Updated Vital Signs BP (!) 154/80 (BP Location: Right Arm)   Pulse 76   Temp 98.9 F (37.2 C) (Oral)   Wt 224 lb (101.6 kg)   SpO2 100%   BMI 29.55 kg/m   Visual Acuity Right Eye Distance:   Left Eye Distance:   Bilateral Distance:    Right Eye Near:   Left Eye Near:    Bilateral Near:     Physical Exam Nursing notes and Vital Signs reviewed. Appearance:  Patient appears stated age, and in no acute distress.    Eyes:  Pupils are equal, round, and reactive to light and accomodation.  Extraocular movement is intact.  Conjunctivae are not inflamed   Pharynx:  Normal; moist mucous membranes  Neck:  Supple.  No adenopathy Lungs:  Clear to auscultation.  Breath sounds are equal.  Moving air well. Heart:  Regular rate and rhythm without murmurs, rubs, or gallops.  Abdomen:  Nontender without masses or hepatosplenomegaly.  Bowel sounds are present.  No CVA or flank tenderness.  Genitourinary:  Penis normal without lesions or urethral discharge.  Scrotum is diffusely tender on the left. Testes are descended bilaterally, and left testicle is diffusely swollen and tender to palpation.  Tender left inguinal lymphadenopathy palpated.  Cremasteric reflex not reliably elicited. Extremities:  No edema.  Skin:  No rash present.     UC Treatments / Results  Labs (all labs ordered are listed, but only abnormal results are displayed) Labs Reviewed  POCT URINALYSIS DIP (MANUAL ENTRY) - Abnormal; Notable for the following components:      Result Value   Blood, UA moderate (*)    Protein Ur, POC =100 (*)    Leukocytes, UA Small (1+) (*)    All other components within normal limits  POCT CBC W AUTO DIFF ([facility]):  WBC 8.1; LY 16.7; MO 6.6; GR 76.7; Hgb 12.1; Platelets 155     EKG None Radiology.6 US Scrotum  Result Date: 06/27/2017 CLINICAL DATA:  Left testicular pain with swelling over the last 3 days, fever, history  of sickle cell disease and hypertension EXAM: SCROTAL ULTRASOUND DOPPLER ULTRASOUND OF THE TESTICLES TECHNIQUE: Complete ultrasound of the testicles, epididymis, and other scrotal structures was performed. Color and spectral Doppler ultrasound were also utilized to evaluate blood flow to the testicles. COMPARISON:  None. FINDINGS: Right testicle Measurements: The right testicle measures 4.2 x 1.8 x 2.8 cm. No intratesticular abnormality is seen. Blood flow is demonstrated to the right testicle with arterial and venous waveforms. Left testicle Measurements: The left testicle measures 3.5 x 2.3 x 2.6 cm. No intratesticular  abnormality is seen. However, there is definite increase in blood flow to the left testicle, with arterial and venous waveforms. Right epididymis: There is some increase in blood flow within the right epididymis. Left epididymis: The left epididymis is prominent, with increased blood flow and increase in size. Hydrocele: There is fluid bilaterally, with septations noted on the left. On the left this may represent pyocele. Varicocele:  A right-sided varicocele is noted. Pulsed Doppler interrogation of both testes demonstrates normal low resistance arterial and venous waveforms bilaterally. IMPRESSION: 1. Increase in blood flow to the left testicle with significant increase in blood flow to the epididymis left-greater-than-right. With hydroceles, septated on the left and possibly due to pyocele, these findings are suspicious for epididymo-orchitis. Recommend urologic consultation. 2. No intratesticular lesion is seen. 3. Small right varicocele. Electronically Signed   By: Ivar Drape M.D.   On: 06/27/2017 12:10   US Scrotum Doppler  Result Date: 06/27/2017 CLINICAL DATA:  Left testicular pain with swelling over the last 3 days, fever, history of sickle cell disease and hypertension EXAM: SCROTAL ULTRASOUND DOPPLER ULTRASOUND OF THE TESTICLES TECHNIQUE: Complete ultrasound of the testicles,  epididymis, and other scrotal structures was performed. Color and spectral Doppler ultrasound were also utilized to evaluate blood flow to the testicles. COMPARISON:  None. FINDINGS: Right testicle Measurements: The right testicle measures 4.2 x 1.8 x 2.8 cm. No intratesticular abnormality is seen. Blood flow is demonstrated to the right testicle with arterial and venous waveforms. Left testicle Measurements: The left testicle measures 3.5 x 2.3 x 2.6 cm. No intratesticular abnormality is seen. However, there is definite increase in blood flow to the left testicle, with arterial and venous waveforms. Right epididymis: There is some increase in blood flow within the right epididymis. Left epididymis: The left epididymis is prominent, with increased blood flow and increase in size. Hydrocele: There is fluid bilaterally, with septations noted on the left. On the left this may represent pyocele. Varicocele:  A right-sided varicocele is noted. Pulsed Doppler interrogation of both testes demonstrates normal low resistance arterial and venous waveforms bilaterally. IMPRESSION: 1. Increase in blood flow to the left testicle with significant increase in blood flow to the epididymis left-greater-than-right. With hydroceles, septated on the left and possibly due to pyocele, these findings are suspicious for epididymo-orchitis. Recommend urologic consultation. 2. No intratesticular lesion is seen. 3. Small right varicocele. Electronically Signed   By: Ivar Drape M.D.   On: 06/27/2017 12:10    Procedures Procedures (including critical care time)  Medications Ordered in UC Medications - No data to display   Initial Impression / Assessment and Plan / UC Course  I have reviewed the triage vital signs and the nursing notes.  Pertinent labs & imaging results that were available during my care of the patient were reviewed by me and considered in my medical decision making (see chart for details).    Discussed with  urologist Dr. Harlow Asa in [facility] who agrees to see patient now.    Final Clinical Impressions(s) / UC Diagnoses   Final diagnoses:  Epididymo-orchitis    ED Discharge Orders    None         Kandra Nicolas, MD 06/29/17 [phone]

## 2017-06-28 ENCOUNTER — Telehealth: Payer: Self-pay

## 2017-06-28 NOTE — Telephone Encounter (Signed)
Left msg for pt asking how he was feeling today. Advised pt to call us if he should have any further questions or concerns.

## 2017-07-07 ENCOUNTER — Ambulatory Visit: Payer: BLUE CROSS/BLUE SHIELD | Admitting: Family Medicine

## 2017-10-01 ENCOUNTER — Other Ambulatory Visit: Payer: Self-pay | Admitting: Family Medicine

## 2017-10-01 DIAGNOSIS — I10 Essential (primary) hypertension: Secondary | ICD-10-CM

## 2017-10-05 ENCOUNTER — Other Ambulatory Visit: Payer: Self-pay | Admitting: Family Medicine

## 2017-10-05 DIAGNOSIS — N529 Male erectile dysfunction, unspecified: Secondary | ICD-10-CM

## 2017-10-07 ENCOUNTER — Telehealth: Payer: Self-pay | Admitting: Sports Medicine

## 2017-10-07 NOTE — Telephone Encounter (Signed)
Received fax from [employer] that [employer] requires a PA. Information has been sent to the insurance company. Awaiting determination.   

## 2017-10-09 ENCOUNTER — Encounter: Payer: Self-pay | Admitting: Sports Medicine

## 2017-10-09 ENCOUNTER — Ambulatory Visit: Payer: BLUE CROSS/BLUE SHIELD | Admitting: Sports Medicine

## 2017-10-09 DIAGNOSIS — N529 Male erectile dysfunction, unspecified: Secondary | ICD-10-CM | POA: Diagnosis not present

## 2017-10-09 DIAGNOSIS — N200 Calculus of kidney: Secondary | ICD-10-CM | POA: Diagnosis not present

## 2017-10-09 MED ORDER — SILDENAFIL CITRATE 20 MG PO TABS: 20.0000 mg | ORAL_TABLET | ORAL | 11 refills | Status: DC | PRN

## 2017-10-09 NOTE — Progress Notes (Signed)
138/91  

## 2017-10-09 NOTE — Assessment & Plan Note (Signed)
Was out of work for a day, FMLA paperwork filled out today.

## 2017-10-09 NOTE — Assessment & Plan Note (Signed)
Needs a refill on sildenafil, Cialis did not work.

## 2017-10-09 NOTE — Progress Notes (Signed)
Subjective:    CC: Follow-up  HPI: Isaac Bowen had nephrolithiasis, he also has erectile dysfunction and needs a refill on his generic Viagra.  He is here because he is out of work and needs FMLA paperwork filled out.  I reviewed the past medical history, family history, social history, surgical history, and allergies today and no changes were needed.  Please see the problem list section below in epic for further details.  Past Medical History: Past Medical History:  Diagnosis Date  . Hypertension   . 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     Past Surgical History: Past Surgical History:  Procedure Laterality Date  . orthoscopic     Social History: Social History   Socioeconomic History  . Marital status: Single    Spouse name: Not on file  . Number of children: Not on file  . Years of education: Not on file  . Highest education level: Not on file  Occupational History  . Not on file  Social Needs  . Financial resource strain: Not on file  . Food insecurity:    Worry: Not on file    Inability: Not on file  . Transportation needs:    Medical: Not on file    Non-medical: Not on file  Tobacco Use  . Smoking status: Never Smoker  . Smokeless tobacco: Never Used  Substance and Sexual Activity  . Alcohol use: Yes    Alcohol/week: 0.0 standard drinks    Comment: 4/week  . Drug use: Yes    Types: Marijuana  . Sexual activity: Yes    Partners: Female  Lifestyle  . Physical activity:    Days per week: Not on file    Minutes per session: Not on file  . Stress: Not on file  Relationships  . Social connections:    Talks on phone: Not on file    Gets together: Not on file    Attends religious service: Not on file    Active member of club or organization: Not on file    Attends meetings of clubs or organizations: Not on file    Relationship status: Not on file   Other Topics Concern  . Not on file  Social History Narrative  . Not on file   Family History: Family History  Problem Relation Age of Onset  . Cancer Mother   . Diabetes Father   . Hyperlipidemia Father   . Hypertension Father   . Stroke Father   . Hypertension Brother    Allergies: Allergies  Allergen Reactions  . Metoprolol Diarrhea and Other (See Comments)    Chest pain   Medications: See med rec.  Review of Systems: No fevers, chills, night sweats, weight loss, chest pain, or shortness of breath.   Objective:    General: Well Developed, well nourished, and in no acute distress.  Neuro: Alert and oriented x3, extra-ocular muscles intact, sensation grossly intact.  HEENT: Normocephalic, atraumatic, pupils equal round reactive to light, neck supple, no masses, no lymphadenopathy, thyroid nonpalpable.  Skin: Warm and dry, no rashes. Cardiac: Regular rate and rhythm, no murmurs rubs or gallops, no lower extremity edema.  Respiratory: Clear to auscultation bilaterally. Not using accessory muscles, speaking in full sentences.  Impression and Recommendations:    Nephrolithiasis Was out of work for a day, FMLA paperwork filled out today.  ED (erectile dysfunction) Needs a refill on sildenafil, Cialis did not  work.  I spent 25 minutes with this patient, greater than 50% was face-to-face time counseling regarding the above diagnoses ___________________________________________ Gwen Her. Dianah Field, M.D., ABFM., CAQSM. Primary Care and [place] Instructor of [place] of [facility] of Medicine

## 2017-10-21 NOTE — Telephone Encounter (Signed)
Per message on covermymeds insurance has approved medication.

## 2017-10-31 ENCOUNTER — Telehealth: Payer: Self-pay | Admitting: Family Medicine

## 2017-10-31 NOTE — Telephone Encounter (Signed)
I received a letter from [employer] regarding short-term disability claim. They want office notes from October 15, 2017 to present.  They are asking questions about return to work date and what I am doing to manage it.  I have not seen you for a while.  Recommend that you schedule a follow-up visit with me soon so that we can get this form completed correctly and justified.

## 2017-11-05 NOTE — Telephone Encounter (Signed)
Spoke with patient, he states he was injured on his part time job and is needing this for his full time job. He is short on funds and also seeing some providers at [employer]..wants to schedule with Dr Georgina Snell to discuss his situation and also the paper work. Pt states he will call back to schedule OV with Dr Georgina Snell.

## 2018-01-19 ENCOUNTER — Other Ambulatory Visit: Payer: Self-pay | Admitting: Family Medicine

## 2018-01-19 DIAGNOSIS — I10 Essential (primary) hypertension: Secondary | ICD-10-CM

## 2018-04-30 ENCOUNTER — Other Ambulatory Visit: Payer: Self-pay | Admitting: Family Medicine

## 2018-04-30 DIAGNOSIS — I10 Essential (primary) hypertension: Secondary | ICD-10-CM

## 2018-08-07 ENCOUNTER — Telehealth: Payer: Self-pay | Admitting: Family Medicine

## 2018-08-07 ENCOUNTER — Other Ambulatory Visit: Payer: Self-pay | Admitting: Family Medicine

## 2018-08-07 DIAGNOSIS — I10 Essential (primary) hypertension: Secondary | ICD-10-CM

## 2018-08-07 NOTE — Telephone Encounter (Signed)
Left voicemail with information below. Let patient know to call us back to schedule an office visit or a virtual.

## 2018-08-07 NOTE — Telephone Encounter (Signed)
Patient due for follow-up regarding hypertension.  30-day supply of blood pressure medication sent to pharmacy this morning but patient needs to schedule follow-up with me soon to get updated labs and to recheck blood pressure.

## 2018-10-29 ENCOUNTER — Other Ambulatory Visit: Payer: Self-pay | Admitting: Sports Medicine

## 2018-10-29 DIAGNOSIS — N529 Male erectile dysfunction, unspecified: Secondary | ICD-10-CM

## 2018-10-29 NOTE — Telephone Encounter (Signed)
To PCP

## 2018-12-03 ENCOUNTER — Encounter: Payer: Self-pay | Admitting: Family Medicine

## 2018-12-03 ENCOUNTER — Ambulatory Visit (INDEPENDENT_AMBULATORY_CARE_PROVIDER_SITE_OTHER): Payer: BC Managed Care – PPO | Admitting: Family Medicine

## 2018-12-03 ENCOUNTER — Other Ambulatory Visit: Payer: Self-pay

## 2018-12-03 VITALS — BP 182/79 | HR 71 | Temp 98.5°F | Wt 214.0 lb

## 2018-12-03 DIAGNOSIS — N1831 Chronic kidney disease, stage 3a: Secondary | ICD-10-CM | POA: Diagnosis not present

## 2018-12-03 DIAGNOSIS — I10 Essential (primary) hypertension: Secondary | ICD-10-CM | POA: Diagnosis not present

## 2018-12-03 DIAGNOSIS — Z6828 Body mass index (BMI) 28.0-28.9, adult: Secondary | ICD-10-CM

## 2018-12-03 DIAGNOSIS — E876 Hypokalemia: Secondary | ICD-10-CM

## 2018-12-03 DIAGNOSIS — D5740 Sickle-cell thalassemia without crisis: Secondary | ICD-10-CM | POA: Diagnosis not present

## 2018-12-03 DIAGNOSIS — Z23 Encounter for immunization: Secondary | ICD-10-CM | POA: Diagnosis not present

## 2018-12-03 DIAGNOSIS — D696 Thrombocytopenia, unspecified: Secondary | ICD-10-CM

## 2018-12-03 DIAGNOSIS — Z125 Encounter for screening for malignant neoplasm of prostate: Secondary | ICD-10-CM

## 2018-12-03 MED ORDER — TADALAFIL 20 MG PO TABS: 10.0000 mg | ORAL_TABLET | ORAL | 11 refills | Status: DC | PRN

## 2018-12-03 MED ORDER — LISINOPRIL-HYDROCHLOROTHIAZIDE 20-12.5 MG PO TABS
ORAL_TABLET | ORAL | 3 refills | Status: DC
Start: 2018-12-03 — End: 2020-01-06

## 2018-12-03 MED ORDER — AMLODIPINE BESYLATE 10 MG PO TABS: 10.0000 mg | ORAL_TABLET | Freq: Every day | ORAL | 3 refills | Status: DC

## 2018-12-03 NOTE — Patient Instructions (Addendum)
Thank you for coming in today. Get labs now.  Restart medicine.  Send me home blood pressure log in 2-3 weeks.  Sign up mychart on your phone.  Recheck with new doc in 6 months.   I will be moving to full time Sports Medicine in [place] starting on November 1st.  You will still be able to see me for your Sports Medicine or Orthopedic needs at [facility] in [place]. I will still be part of [facility].    If you want to stay locally for your Sports Medicine issues Dr. Dianah Field here in [place] will be happy to see you.  Additionally Dr. Clearance Coots at [facility] will be happy to see you for sports medicine issues more locally.   For your primary care needs you are welcome to establish care with Dr. Emeterio Reeve.  We are working quickly to hire more physicians to cover the primary care needs however if you cannot get an appointment with Dr. Sheppard Coil in a timely manner [facility] has locations and openings for primary care services nearby.   [facility] at [facility] [address] . [place] , [place]: [phone] . Behavioral Medicine: [phone] . Fax: [place] at [employer] [address] . [place], [place]: [phone] . Behavioral Medicine: [phone] . Fax: [phone] . Hours (M-F): 7am - Academic librarian At [facility]. [place], [place]: [phone] . Behavioral Medicine: [phone] . Fax: [phone] . Hours (M-F): 8am - Optician, dispensing at [employer] . [place], [place] Phone: [phone] . Behavioral Medicine: [phone] . Fax: [phone]

## 2018-12-03 NOTE — Progress Notes (Addendum)
Isaac Bowen is a 53 y.o. male who presents to [facility]: Primary Care Sports Medicine today for follow-up hypertension and CKD.  Patient has been somewhat lost to follow-up with last assessment by me December 2018.  Hypertension: Managed with amlodipine and lisinopril/hydrochlorothiazide. BP elevated to 182/79 today. Patient ran out of medications in July and due to transitioning between jobs and insurances, had to wait 90 days for insurance coverage to begin before seeking care. Patient has been checking BP at home since he has been off of medication and reports he has been running in the 170s-180s/80s-90s. Denies chest pain, n/v, dizzines, vision changes.   CKD stage III: Last lab available to me shows creatinine at 1.36 October 2018.   Sickle cell thalassemia: Typically doing well without crisis.  Last hospitalization for this was in the 1990s. Denies any fatigue, SOB, dizziness.   Kidney stones: Last seen by my partner Dr. Dianah Field for this on August 2019. Patient denies any recurrence of kidney stones.   ROS as above  Exam:  BP (!) 182/79   Pulse 71   Temp 98.5 F (36.9 C) (Oral)   Wt 214 lb (97.1 kg)   BMI 28.23 kg/m  Wt Readings from Last 5 Encounters:  12/03/18 214 lb (97.1 kg)  10/09/17 217 lb (98.4 kg)  06/27/17 224 lb (101.6 kg)  02/27/17 226 lb (102.5 kg)  11/28/16 217 lb (98.4 kg)    Gen: Well NAD HEENT: EOMI,  MMM Lungs: Normal work of breathing. CTABL Heart: RRR no MRG Abd: NABS, Soft. Nondistended, Nontender Exts: Brisk capillary refill, warm and well perfused.     Assessment and Plan: 53 y.o. male here for follow up of HTN and CKD.   Hypertension - Refilled amlodipine 10 mg and lisinopril-HCTZ 20-12.5 mg. Instructed patient to track and record BP over the next 2-3 weeks and send me a message via MyChart. At that point, will evaluate possible need to  change BP meds.  - Order lipid panel to follow up on cholesterol  Recheck 6 to 12 months.  CKD, Stage III - Recheck CBC, CMP   Erectile dysfunction: Transition to Cialis.  We will recheck lipid panel and PSA as well. Flu vaccine given today prior to discharge.  I informed patient that I am transitioning to sports medicine only [facility] in [place] starting in November.  Happy to see patient for continued sports medicine needs.  Discussed need for new PCP.  Provided some recommendations.  Addendum  Potassium off of blood pressure medication was 3.0.  Plan to recheck basic metabolic panel in about 2 weeks on medication. Additionally CBC showed platelet count at 76 however due to RBC morphology count was not thought to be accurate.  Will order peripheral smear as well to double check platelets.  PDMP not reviewed this encounter. Orders Placed This Encounter  Procedures  . Flu Vaccine QUAD 36+ mos IM  . CBC  . COMPLETE METABOLIC PANEL WITH GFR  . PSA  . Lipid Panel w/reflex Direct LDL   Meds ordered this encounter  Medications  . amLODipine (NORVASC) 10 MG tablet    Sig: Take 1 tablet (10 mg total) by mouth daily.    Dispense:  90 tablet    Refill:  3  . lisinopril-hydrochlorothiazide (ZESTORETIC) 20-12.5 MG tablet    Sig: TAKE 1 TABLET BY MOUTH ONCE DAILY.    Dispense:  90 tablet    Refill:  3  . tadalafil (  CIALIS) 20 MG tablet    Sig: Take 0.5-1 tablets (10-20 mg total) by mouth every other day as needed for erectile dysfunction.    Dispense:  30 tablet    Refill:  11     Historical information moved to improve visibility of documentation.  Past Medical History:  Diagnosis Date  . Hypertension   . 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     Past Surgical History:  Procedure Laterality Date  . orthoscopic     Social History   Tobacco  Use  . Smoking status: Never Smoker  . Smokeless tobacco: Never Used  Substance Use Topics  . Alcohol use: Yes    Alcohol/week: 0.0 standard drinks    Comment: 4/week   family history includes Cancer in his mother; Diabetes in his father; Hyperlipidemia in his father; Hypertension in his brother and father; Stroke in his father.  Medications: Current Outpatient Medications  Medication Sig Dispense Refill  . amLODipine (NORVASC) 10 MG tablet Take 1 tablet (10 mg total) by mouth daily. 90 tablet 3  . folic acid (FOLVITE) 1 MG tablet Take 2 tablets (2 mg total) by mouth daily. 180 tablet 4  . lisinopril-hydrochlorothiazide (ZESTORETIC) 20-12.5 MG tablet TAKE 1 TABLET BY MOUTH ONCE DAILY. 90 tablet 3  . tadalafil (CIALIS) 20 MG tablet Take 0.5-1 tablets (10-20 mg total) by mouth every other day as needed for erectile dysfunction. 30 tablet 11   No current facility-administered medications for this visit.    Allergies  Allergen Reactions  . Metoprolol Diarrhea and Other (See Comments)    Chest pain     Discussed warning signs or symptoms. Please see discharge instructions. Patient expresses understanding.   I personally was present and performed or re-performed the history, physical exam and medical decision-making activities of this service and have verified that the service and findings are accurately documented in the student's note. ___________________________________________ Lynne Leader M.D., ABFM., CAQSM. Primary Care and Sports Medicine Adjunct Instructor of [place] of [facility] of Medicine

## 2018-12-03 NOTE — Progress Notes (Signed)
Note duplication error 

## 2018-12-04 LAB — CBC
HCT: 38.9 % (ref 38.5–50.0)
Hemoglobin: 13 g/dL — ABNORMAL LOW (ref 13.2–17.1)
MCH: 28.4 pg (ref 27.0–33.0)
MCHC: 33.4 g/dL (ref 32.0–36.0)
MCV: 84.9 fL (ref 80.0–100.0)
Platelets: 76 10*3/uL — ABNORMAL LOW (ref 140–400)
RBC: 4.58 10*6/uL (ref 4.20–5.80)
RDW: 17.2 % — ABNORMAL HIGH (ref 11.0–15.0)
WBC: 4.5 10*3/uL (ref 3.8–10.8)

## 2018-12-04 LAB — COMPLETE METABOLIC PANEL WITH GFR
AG Ratio: 1.6 (calc) (ref 1.0–2.5)
ALT: 23 U/L (ref 9–46)
AST: 27 U/L (ref 10–35)
Albumin: 4.1 g/dL (ref 3.6–5.1)
Alkaline phosphatase (APISO): 57 U/L (ref 35–144)
BUN/Creatinine Ratio: 10 (calc) (ref 6–22)
BUN: 14 mg/dL (ref 7–25)
CO2: 29 mmol/L (ref 20–32)
Calcium: 8.9 mg/dL (ref 8.6–10.3)
Chloride: 105 mmol/L (ref 98–110)
Creat: 1.36 mg/dL — ABNORMAL HIGH (ref 0.70–1.33)
GFR, Est African American: 68 mL/min/{1.73_m2} (ref >=60)
GFR, Est Non African American: 59 mL/min/{1.73_m2} — ABNORMAL LOW (ref >=60)
Globulin: 2.5 g/dL (calc) (ref 1.9–3.7)
Glucose, Bld: 86 mg/dL (ref 65–99)
Potassium: 3 mmol/L — ABNORMAL LOW (ref 3.5–5.3)
Sodium: 142 mmol/L (ref 135–146)
Total Bilirubin: 2.6 mg/dL — ABNORMAL HIGH (ref 0.2–1.2)
Total Protein: 6.6 g/dL (ref 6.1–8.1)

## 2018-12-04 LAB — LIPID PANEL W/REFLEX DIRECT LDL
Cholesterol: 142 mg/dL (ref <200)
HDL: 61 mg/dL (ref >=40)
LDL Cholesterol (Calc): 69 mg/dL (calc)
Non-HDL Cholesterol (Calc): 81 mg/dL (calc) (ref <130)
Total CHOL/HDL Ratio: 2.3 (calc) (ref <5.0)
Triglycerides: 41 mg/dL (ref <150)

## 2018-12-04 LAB — PSA: PSA: 0.9 ng/mL (ref <=4.0)

## 2018-12-04 NOTE — Addendum Note (Signed)
Addended by: Gregor Hams on: 12/04/2018 07:30 AM   Modules accepted: Orders

## 2019-04-14 ENCOUNTER — Encounter: Payer: Self-pay | Admitting: Sports Medicine

## 2019-04-14 ENCOUNTER — Ambulatory Visit (INDEPENDENT_AMBULATORY_CARE_PROVIDER_SITE_OTHER): Payer: BC Managed Care – PPO

## 2019-04-14 ENCOUNTER — Ambulatory Visit (INDEPENDENT_AMBULATORY_CARE_PROVIDER_SITE_OTHER): Payer: BC Managed Care – PPO | Admitting: Sports Medicine

## 2019-04-14 ENCOUNTER — Other Ambulatory Visit: Payer: Self-pay

## 2019-04-14 DIAGNOSIS — M545 Low back pain, unspecified: Secondary | ICD-10-CM

## 2019-04-14 DIAGNOSIS — M5459 Other low back pain: Secondary | ICD-10-CM | POA: Insufficient documentation

## 2019-04-14 MED ORDER — PREDNISONE 50 MG PO TABS: ORAL_TABLET | ORAL | 0 refills | Status: DC

## 2019-04-14 NOTE — Assessment & Plan Note (Addendum)
Acute lower thoracic/upper lumbar low back pain after twisting. Nothing overtly radicular. Does have a history of nephrolithiasis. Prednisone, x-rays, urinalysis (will deliver to lab at some point since he cannot void here in the office). Home rehab exercises given, return to see me in a month. Of note he did have low-grade fevers recently, if he is not better in a week or 2 I had like to see him back and consider advanced imaging.

## 2019-04-14 NOTE — Progress Notes (Signed)
    Procedures performed today:    None.  Independent interpretation of tests performed by another provider:   None.  Impression and Recommendations:    Acute low back pain Acute lower thoracic/upper lumbar low back pain after twisting. Nothing overtly radicular. Does have a history of nephrolithiasis. Prednisone, x-rays, urinalysis (will deliver to lab at some point since he cannot void here in the office). Home rehab exercises given, return to see me in a month. Of note he did have low-grade fevers recently, if he is not better in a week or 2 I had like to see him back and consider advanced imaging.    ___________________________________________ Gwen Her. Dianah Field, M.D., ABFM., CAQSM. Primary Care and [place] Instructor of [place] of [facility] of Medicine

## 2019-04-15 LAB — URINALYSIS W MICROSCOPIC + REFLEX CULTURE
Bacteria, UA: NONE SEEN /HPF
Bilirubin Urine: NEGATIVE
Glucose, UA: NEGATIVE
Hgb urine dipstick: NEGATIVE
Hyaline Cast: NONE SEEN /LPF
Ketones, ur: NEGATIVE
Leukocyte Esterase: NEGATIVE
Nitrites, Initial: NEGATIVE
Protein, ur: NEGATIVE
RBC / HPF: NONE SEEN /HPF (ref 0–2)
Specific Gravity, Urine: 1.008 (ref 1.001–1.03)
Squamous Epithelial / HPF: NONE SEEN /HPF (ref <=5)
WBC, UA: NONE SEEN /HPF (ref 0–5)
pH: 6.5 (ref 5.0–8.0)

## 2019-04-15 LAB — NO CULTURE INDICATED

## 2019-04-19 ENCOUNTER — Other Ambulatory Visit: Payer: Self-pay

## 2019-04-19 ENCOUNTER — Ambulatory Visit (INDEPENDENT_AMBULATORY_CARE_PROVIDER_SITE_OTHER): Payer: BC Managed Care – PPO

## 2019-04-19 ENCOUNTER — Ambulatory Visit (INDEPENDENT_AMBULATORY_CARE_PROVIDER_SITE_OTHER): Payer: BC Managed Care – PPO | Admitting: Sports Medicine

## 2019-04-19 ENCOUNTER — Encounter: Payer: Self-pay | Admitting: Sports Medicine

## 2019-04-19 DIAGNOSIS — M545 Low back pain, unspecified: Secondary | ICD-10-CM

## 2019-04-19 DIAGNOSIS — M2550 Pain in unspecified joint: Secondary | ICD-10-CM | POA: Diagnosis not present

## 2019-04-19 MED ORDER — PREDNISONE 10 MG (48) PO TBPK: ORAL_TABLET | Freq: Every day | ORAL | 0 refills | Status: DC

## 2019-04-19 MED ORDER — HYDROCODONE-ACETAMINOPHEN 5-325 MG PO TABS: 1.0000 | ORAL_TABLET | Freq: Three times a day (TID) | ORAL | 0 refills | Status: DC | PRN

## 2019-04-19 NOTE — Assessment & Plan Note (Addendum)
Increasing arthralgias. Low-grade fevers. I am going to swab him for COVID-19. Also checking CBC, CMP, autoimmune testing, viral serologies. CK. 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 This is essentially perfect storm for causes of arthralgia, in a patient that we are testing for COVID-19 and thus are limited in doing his testing. He will have to return after his COVID-19 test comes back negative before we can obtain his labs. He will hydrate himself aggressively in the meantime. Prednisone should help this as well.

## 2019-04-19 NOTE — Assessment & Plan Note (Signed)
Isaac Bowen returns, he continues to have severe low back pain, now having some urinary and stool dysfunction. This is in spite of a burst of prednisone.   At the last visit his urinalysis was negative and x-rays showed no evidence of nephrolithiasis. For this reason we are going to proceed with MRI of his lumbar spine on an urgent basis. Adding a 12-day taper of prednisone, hydrocodone.

## 2019-04-19 NOTE — Progress Notes (Signed)
    Procedures performed today:    None.  Independent interpretation of tests performed by another provider:   Lumbar spine x-rays personally reviewed and show multilevel degenerative changes.  Impression and Recommendations:    Acute low back pain Aiven returns, he continues to have severe low back pain, now having some urinary and stool dysfunction. This is in spite of a burst of prednisone.   At the last visit his urinalysis was negative and x-rays showed no evidence of nephrolithiasis. For this reason we are going to proceed with MRI of his lumbar spine on an urgent basis. Adding a 12-day taper of prednisone, hydrocodone.  Polyarthralgia Increasing arthralgias. Low-grade fevers. I am going to swab him for COVID-19. Also checking CBC, CMP, autoimmune testing, viral serologies. CK. 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 This is essentially perfect storm for causes of arthralgia, in a patient that we are testing for COVID-19 and thus are limited in doing his testing. He will have to return after his COVID-19 test comes back negative before we can obtain his labs. He will hydrate himself aggressively in the meantime. Prednisone should help this as well.    ___________________________________________ Gwen Her. Dianah Field, M.D., ABFM., CAQSM. Primary Care and [place] Instructor of [place] of [facility] of Medicine

## 2019-04-21 ENCOUNTER — Other Ambulatory Visit: Payer: Self-pay

## 2019-04-21 ENCOUNTER — Ambulatory Visit (INDEPENDENT_AMBULATORY_CARE_PROVIDER_SITE_OTHER): Payer: BC Managed Care – PPO | Admitting: Sports Medicine

## 2019-04-21 ENCOUNTER — Encounter: Payer: Self-pay | Admitting: Sports Medicine

## 2019-04-21 DIAGNOSIS — M2550 Pain in unspecified joint: Secondary | ICD-10-CM

## 2019-04-21 DIAGNOSIS — M545 Low back pain, unspecified: Secondary | ICD-10-CM

## 2019-04-21 DIAGNOSIS — R7871 Abnormal lead level in blood: Secondary | ICD-10-CM | POA: Diagnosis not present

## 2019-04-21 LAB — NOVEL CORONAVIRUS, NAA: SARS-CoV-2, NAA: NOT DETECTED

## 2019-04-21 LAB — SPECIMEN STATUS REPORT

## 2019-04-21 MED ORDER — OXYCODONE-ACETAMINOPHEN 10-325 MG PO TABS: 1.0000 | ORAL_TABLET | ORAL | 0 refills | Status: DC | PRN

## 2019-04-21 NOTE — Assessment & Plan Note (Signed)
Isaac Bowen is back, he continues to have severe low back pain, he did have some severe facet arthritis on his MRI. He only had minor improvement with the prednisone. Urinalysis was negative and x-ray showed no evidence of nephrolithiasis. If his lab work does not point towards a sickle crisis we will proceed with facet joint injections.

## 2019-04-21 NOTE — Progress Notes (Addendum)
    Procedures performed today:    None.  Independent interpretation of tests performed by another provider:   None.  Impression and Recommendations:    Lumbar facet joint pain Isaac Bowen is back, he continues to have severe low back pain, he did have some severe facet arthritis on his MRI. He only had minor improvement with the prednisone. Urinalysis was negative and x-ray showed no evidence of nephrolithiasis. If his lab work does not point towards a sickle crisis we will proceed with facet joint injections.  Polyarthralgia Isaac Bowen has low-grade fevers, increasing arthralgias, he has sickle cell trait plus thalassemia. In general patients with sickle cell trait will not have crises but when combined with thalassemia they certainly can. COVID-19 swab was negative. He can go get his labs today. If we see increased bilirubin or increased lactate dehydrogenase that is a sign of hemolysis which would indicate sickle crisis. Treatment would be aggressive hydration and narcotic treatment. For now his hydrocodone is not effective so we are can increase to high-dose Percocet, he will aggressively hydrate. If anemic we will set him up for blood transfusion.  Elevated blood lead level Lead levels are mildly elevated, likely with occupational exposure, for now treatment is observation, I do not think he needs chelation yet, he does need to follow-up with his PCP for this.    ___________________________________________ Gwen Her. Dianah Field, M.D., ABFM., CAQSM. Primary Care and [place] Instructor of [place] of [facility] of Medicine

## 2019-04-21 NOTE — Assessment & Plan Note (Signed)
Paxton has low-grade fevers, increasing arthralgias, he has sickle cell trait plus thalassemia. In general patients with sickle cell trait will not have crises but when combined with thalassemia they certainly can. COVID-19 swab was negative. He can go get his labs today. If we see increased bilirubin or increased lactate dehydrogenase that is a sign of hemolysis which would indicate sickle crisis. Treatment would be aggressive hydration and narcotic treatment. For now his hydrocodone is not effective so we are can increase to high-dose Percocet, he will aggressively hydrate. If anemic we will set him up for blood transfusion.

## 2019-04-22 DIAGNOSIS — M5459 Other low back pain: Secondary | ICD-10-CM

## 2019-04-22 DIAGNOSIS — M545 Low back pain: Secondary | ICD-10-CM

## 2019-04-22 NOTE — Assessment & Plan Note (Signed)
No evidence of sickle crisis on labs, proceeding with right L4-S1 facet injections. Return 4 weeks after injections to evaluate relief.

## 2019-04-22 NOTE — Telephone Encounter (Signed)
Facet injections ordered, please contact [facility] for scheduling.

## 2019-04-23 LAB — HEAVY METALS PANEL, BLOOD
Arsenic: <10 mcg/L (ref <23)
Lead: 12 ug/dL — ABNORMAL HIGH (ref <5)
Mercury, B: <5 mcg/L (ref 0–10)

## 2019-04-23 LAB — LACTATE DEHYDROGENASE: LDH: 231 U/L (ref 120–250)

## 2019-04-23 NOTE — Telephone Encounter (Signed)
LMOM with Roberta at [facility].

## 2019-04-24 DIAGNOSIS — D5700 Hb-SS disease with crisis, unspecified: Secondary | ICD-10-CM | POA: Insufficient documentation

## 2019-04-25 DIAGNOSIS — R7871 Abnormal lead level in blood: Secondary | ICD-10-CM | POA: Insufficient documentation

## 2019-04-25 MED ORDER — CYCLOBENZAPRINE HCL 10 MG PO TABS
5.00 | ORAL_TABLET | ORAL | Status: DC
Start: ? — End: 2019-04-25

## 2019-04-25 MED ORDER — SENNOSIDES-DOCUSATE SODIUM 8.6-50 MG PO TABS
1.00 | ORAL_TABLET | ORAL | Status: DC
Start: 2019-05-02 — End: 2019-04-25

## 2019-04-25 MED ORDER — SODIUM CHLORIDE 0.9 % IV SOLN
INTRAVENOUS | Status: DC
Start: ? — End: 2019-04-25

## 2019-04-25 MED ORDER — NALOXONE HCL 0.4 MG/ML IJ SOLN
0.10 | INTRAMUSCULAR | Status: DC
Start: ? — End: 2019-04-25

## 2019-04-25 MED ORDER — AMLODIPINE BESYLATE 5 MG PO TABS
10.00 | ORAL_TABLET | ORAL | Status: DC
Start: 2019-05-03 — End: 2019-04-25

## 2019-04-25 MED ORDER — HYDROCHLOROTHIAZIDE 12.5 MG PO CAPS
12.50 | ORAL_CAPSULE | ORAL | Status: DC
Start: 2019-05-03 — End: 2019-04-25

## 2019-04-25 MED ORDER — ACETAMINOPHEN 325 MG PO TABS
650.00 | ORAL_TABLET | ORAL | Status: DC
Start: ? — End: 2019-04-25

## 2019-04-25 MED ORDER — NALOXONE HCL 0.4 MG/ML IJ SOLN
0.40 | INTRAMUSCULAR | Status: DC
Start: ? — End: 2019-04-25

## 2019-04-25 MED ORDER — ONDANSETRON HCL 4 MG/2ML IJ SOLN
4.00 | INTRAMUSCULAR | Status: DC
Start: ? — End: 2019-04-25

## 2019-04-25 MED ORDER — HYDROMORPHONE HCL 1 MG/ML IJ SOLN
0.80 | INTRAMUSCULAR | Status: DC
Start: ? — End: 2019-04-25

## 2019-04-25 MED ORDER — DEXAMETHASONE 4 MG PO TABS
40.00 | ORAL_TABLET | ORAL | Status: DC
Start: 2019-04-26 — End: 2019-04-25

## 2019-04-25 MED ORDER — FOLIC ACID 1 MG PO TABS
1.00 | ORAL_TABLET | ORAL | Status: DC
Start: 2019-05-03 — End: 2019-04-25

## 2019-04-25 MED ORDER — LISINOPRIL 5 MG PO TABS
10.00 | ORAL_TABLET | ORAL | Status: DC
Start: 2019-04-26 — End: 2019-04-25

## 2019-04-25 MED ORDER — MELATONIN 3 MG PO TABS
6.00 | ORAL_TABLET | ORAL | Status: DC
Start: ? — End: 2019-04-25

## 2019-04-25 NOTE — Assessment & Plan Note (Signed)
Lead levels are mildly elevated, likely with occupational exposure, for now treatment is observation, I do not think he needs chelation yet, he does need to follow-up with his PCP for this.

## 2019-04-28 ENCOUNTER — Other Ambulatory Visit: Payer: BC Managed Care – PPO

## 2019-04-28 ENCOUNTER — Ambulatory Visit: Payer: BC Managed Care – PPO | Admitting: Sports Medicine

## 2019-04-28 ENCOUNTER — Inpatient Hospital Stay: Admission: RE | Admit: 2019-04-28 | Payer: BC Managed Care – PPO | Source: Ambulatory Visit

## 2019-04-29 LAB — ANALYZER(R)ANA IFA WITH REFLEX TITER/PATTRN,SYS AUTOIMM PNL1
14-3-3 eta Protein: <0.2 ng/mL (ref <0.2)
Anti Nuclear Antibody (ANA): NEGATIVE
Anticardiolipin IgA: <11 [APL'U]
Anticardiolipin IgG: <14 [GPL'U]
Anticardiolipin IgM: <12 [MPL'U]
Beta-2 Glyco 1 IgA: <9 SAU (ref <=20)
Beta-2 Glyco 1 IgM: 10 SMU (ref <=20)
Beta-2 Glyco I IgG: <9 SGU (ref <=20)
C3 Complement: 94 mg/dL (ref 82–185)
C4 Complement: 25 mg/dL (ref 15–53)
Centromere Ab Screen: <1 AI
Chromatin (Nucleosomal) Antibody: <1 AI
Cyclic Citrullin Peptide Ab: <16 Units
DNA Ab (DS) Crithidia, IFA: NEGATIVE
ENA SM Ab Ser-aCnc: <1 AI
Jo-1 Autoabs: <1 AI
Rheumatoid Factor (IgA): <5 U
Rheumatoid Factor (IgG): 11 U — ABNORMAL HIGH
Rheumatoid Factor (IgM): 20 U — ABNORMAL HIGH
Ribonucleic Protein(ENA) Antibody, IgG: <1 AI
SM/RNP: <1 AI
SSA (Ro) (ENA) Antibody, IgG: <1 AI
SSB (La) (ENA) Antibody, IgG: <1 AI
Scleroderma (Scl-70) (ENA) Antibody, IgG: <1 AI
Thyroperoxidase Ab SerPl-aCnc: <1 IU/mL (ref <9)

## 2019-04-29 LAB — COMPREHENSIVE METABOLIC PANEL
AG Ratio: 2 (calc) (ref 1.0–2.5)
ALT: 9 U/L (ref 9–46)
AST: 8 U/L — ABNORMAL LOW (ref 10–35)
Albumin: 4.2 g/dL (ref 3.6–5.1)
Alkaline phosphatase (APISO): 58 U/L (ref 35–144)
BUN: 15 mg/dL (ref 7–25)
CO2: 29 mmol/L (ref 20–32)
Calcium: 8.8 mg/dL (ref 8.6–10.3)
Chloride: 102 mmol/L (ref 98–110)
Creat: 1.3 mg/dL (ref 0.70–1.33)
Globulin: 2.1 g/dL (calc) (ref 1.9–3.7)
Glucose, Bld: 94 mg/dL (ref 65–99)
Potassium: 3.5 mmol/L (ref 3.5–5.3)
Sodium: 142 mmol/L (ref 135–146)
Total Bilirubin: 1 mg/dL (ref 0.2–1.2)
Total Protein: 6.3 g/dL (ref 6.1–8.1)

## 2019-04-29 LAB — CMV IGM: CMV IgM: <30 AU/mL

## 2019-04-29 LAB — CBC WITH DIFFERENTIAL/PLATELET
Absolute Monocytes: 464 cells/uL (ref 200–950)
Basophils Absolute: 24 cells/uL (ref 0–200)
Basophils Relative: 0.3 %
Eosinophils Absolute: 0 {cells}/uL — ABNORMAL LOW (ref 15–500)
Eosinophils Relative: 0 %
HCT: 40 % (ref 38.5–50.0)
Hemoglobin: 13.5 g/dL (ref 13.2–17.1)
Lymphs Abs: 816 cells/uL — ABNORMAL LOW (ref 850–3900)
MCH: 29 pg (ref 27.0–33.0)
MCHC: 33.8 g/dL (ref 32.0–36.0)
MCV: 86 fL (ref 80.0–100.0)
Monocytes Relative: 5.8 %
Neutro Abs: 6696 cells/uL (ref 1500–7800)
Neutrophils Relative %: 83.7 %
Platelets: 168 10*3/uL (ref 140–400)
RBC: 4.65 10*6/uL (ref 4.20–5.80)
RDW: 15.1 % — ABNORMAL HIGH (ref 11.0–15.0)
Total Lymphocyte: 10.2 %
WBC: 8 10*3/uL (ref 3.8–10.8)

## 2019-04-29 LAB — CK: Total CK: 29 U/L — ABNORMAL LOW (ref 44–196)

## 2019-04-29 LAB — EPSTEIN-BARR VIRUS VCA, IGG: EBV VCA IgG: >750 U/mL — ABNORMAL HIGH

## 2019-04-29 LAB — EPSTEIN-BARR VIRUS VCA, IGM: EBV VCA IgM: <36 U/mL

## 2019-04-29 LAB — URIC ACID: Uric Acid, Serum: 5.7 mg/dL (ref 4.0–8.0)

## 2019-04-29 MED ORDER — HYDROMORPHONE HCL 1 MG/ML IJ SOLN
1.00 | INTRAMUSCULAR | Status: DC
Start: ? — End: 2019-04-29

## 2019-04-29 MED ORDER — LIDOCAINE 4 % EX PTCH
1.00 | MEDICATED_PATCH | CUTANEOUS | Status: DC
Start: 2019-04-29 — End: 2019-04-29

## 2019-04-29 MED ORDER — HYDRALAZINE HCL 20 MG/ML IJ SOLN
10.00 | INTRAMUSCULAR | Status: DC
Start: ? — End: 2019-04-29

## 2019-04-29 MED ORDER — INSULIN LISPRO 100 UNIT/ML ~~LOC~~ SOLN
2.00 | SUBCUTANEOUS | Status: DC
Start: 2019-05-03 — End: 2019-04-29

## 2019-04-29 MED ORDER — GLUCOSE 40 % PO GEL
15.00 | ORAL | Status: DC
Start: ? — End: 2019-04-29

## 2019-04-29 MED ORDER — METHYLPREDNISOLONE SODIUM SUCC 125 MG IJ SOLR
80.00 | INTRAMUSCULAR | Status: DC
Start: 2019-05-03 — End: 2019-04-29

## 2019-04-29 MED ORDER — OXYCODONE HCL ER 10 MG PO T12A
10.00 | EXTENDED_RELEASE_TABLET | ORAL | Status: DC
Start: 2019-04-29 — End: 2019-04-29

## 2019-04-29 MED ORDER — CLONIDINE HCL 0.1 MG PO TABS
0.20 | ORAL_TABLET | ORAL | Status: DC
Start: ? — End: 2019-04-29

## 2019-04-29 MED ORDER — POLYETHYLENE GLYCOL 3350 17 GM/SCOOP PO POWD
17.00 | ORAL | Status: DC
Start: 2019-05-03 — End: 2019-04-29

## 2019-04-29 MED ORDER — LISINOPRIL 20 MG PO TABS
20.00 | ORAL_TABLET | ORAL | Status: DC
Start: 2019-04-30 — End: 2019-04-29

## 2019-04-29 MED ORDER — DEXTROSE 10 % IV SOLN
125.00 | INTRAVENOUS | Status: DC
Start: ? — End: 2019-04-29

## 2019-04-29 MED ORDER — GLUCAGON (RDNA) 1 MG IJ KIT
1.00 | PACK | INTRAMUSCULAR | Status: DC
Start: ? — End: 2019-04-29

## 2019-05-03 DIAGNOSIS — K219 Gastro-esophageal reflux disease without esophagitis: Secondary | ICD-10-CM | POA: Insufficient documentation

## 2019-05-03 MED ORDER — PANTOPRAZOLE SODIUM 40 MG PO TBEC
40.00 | DELAYED_RELEASE_TABLET | ORAL | Status: DC
Start: 2019-05-03 — End: 2019-05-03

## 2019-05-03 MED ORDER — GENERIC EXTERNAL MEDICATION
Status: DC
Start: ? — End: 2019-05-03

## 2019-05-03 MED ORDER — ONDANSETRON HCL 4 MG/2ML IJ SOLN
4.00 | INTRAMUSCULAR | Status: DC
Start: ? — End: 2019-05-03

## 2019-05-03 MED ORDER — AMLODIPINE BESYLATE 5 MG PO TABS
10.00 | ORAL_TABLET | ORAL | Status: DC
Start: 2019-05-07 — End: 2019-05-03

## 2019-05-03 MED ORDER — POLYETHYLENE GLYCOL 3350 17 GM/SCOOP PO POWD
17.00 | ORAL | Status: DC
Start: 2019-05-06 — End: 2019-05-03

## 2019-05-03 MED ORDER — LACTATED RINGERS IV SOLN
INTRAVENOUS | Status: DC
Start: ? — End: 2019-05-03

## 2019-05-03 MED ORDER — FOLIC ACID 1 MG PO TABS
1.00 | ORAL_TABLET | ORAL | Status: DC
Start: 2019-05-07 — End: 2019-05-03

## 2019-05-03 MED ORDER — METHYLPREDNISOLONE SODIUM SUCC 125 MG IJ SOLR
80.00 | INTRAMUSCULAR | Status: DC
Start: 2019-05-06 — End: 2019-05-03

## 2019-05-03 MED ORDER — GENERIC EXTERNAL MEDICATION
30.00 | Status: DC
Start: 2019-05-03 — End: 2019-05-03

## 2019-05-03 MED ORDER — OXYCODONE-ACETAMINOPHEN 10-325 MG PO TABS
1.00 | ORAL_TABLET | ORAL | Status: DC
Start: ? — End: 2019-05-03

## 2019-05-03 MED ORDER — MELATONIN 3 MG PO TABS
6.00 | ORAL_TABLET | ORAL | Status: DC
Start: ? — End: 2019-05-03

## 2019-05-03 MED ORDER — TIZANIDINE HCL 2 MG PO TABS
2.00 | ORAL_TABLET | ORAL | Status: DC
Start: 2019-05-06 — End: 2019-05-03

## 2019-05-03 MED ORDER — SENNOSIDES-DOCUSATE SODIUM 8.6-50 MG PO TABS
2.00 | ORAL_TABLET | ORAL | Status: DC
Start: 2019-05-06 — End: 2019-05-03

## 2019-05-03 MED ORDER — GENERIC EXTERNAL MEDICATION
30.00 | Status: DC
Start: 2019-05-07 — End: 2019-05-03

## 2019-05-03 MED ORDER — PANTOPRAZOLE SODIUM 40 MG PO TBEC
40.00 | DELAYED_RELEASE_TABLET | ORAL | Status: DC
Start: 2019-05-07 — End: 2019-05-03

## 2019-05-03 MED ORDER — SODIUM CHLORIDE 0.9 % IV SOLN
250.00 | INTRAVENOUS | Status: DC
Start: ? — End: 2019-05-03

## 2019-05-03 MED ORDER — LIDOCAINE 4 % EX PTCH
1.00 | MEDICATED_PATCH | CUTANEOUS | Status: DC
Start: 2019-05-06 — End: 2019-05-03

## 2019-05-06 MED ORDER — SODIUM CHLORIDE 0.9 % IV SOLN
250.00 | INTRAVENOUS | Status: DC
Start: 2019-05-06 — End: 2019-05-06

## 2019-05-23 DIAGNOSIS — R7689 Other specified abnormal immunological findings in serum: Secondary | ICD-10-CM | POA: Insufficient documentation

## 2019-05-23 DIAGNOSIS — R768 Other specified abnormal immunological findings in serum: Secondary | ICD-10-CM | POA: Insufficient documentation

## 2019-05-25 DIAGNOSIS — M7551 Bursitis of right shoulder: Secondary | ICD-10-CM | POA: Insufficient documentation

## 2019-05-26 DIAGNOSIS — D5744 Sickle-cell thalassemia beta plus without crisis: Secondary | ICD-10-CM | POA: Insufficient documentation

## 2019-05-26 DIAGNOSIS — G8929 Other chronic pain: Secondary | ICD-10-CM | POA: Insufficient documentation

## 2019-05-27 DIAGNOSIS — R161 Splenomegaly, not elsewhere classified: Secondary | ICD-10-CM | POA: Insufficient documentation

## 2019-05-27 DIAGNOSIS — I272 Pulmonary hypertension, unspecified: Secondary | ICD-10-CM | POA: Insufficient documentation

## 2019-07-01 DIAGNOSIS — M87052 Idiopathic aseptic necrosis of left femur: Secondary | ICD-10-CM | POA: Insufficient documentation

## 2019-07-01 DIAGNOSIS — M87051 Idiopathic aseptic necrosis of right femur: Secondary | ICD-10-CM | POA: Insufficient documentation

## 2019-08-05 DIAGNOSIS — Z96612 Presence of left artificial shoulder joint: Secondary | ICD-10-CM | POA: Insufficient documentation

## 2019-08-25 ENCOUNTER — Other Ambulatory Visit: Payer: Self-pay | Admitting: Family Medicine

## 2019-08-25 DIAGNOSIS — N529 Male erectile dysfunction, unspecified: Secondary | ICD-10-CM

## 2019-08-25 NOTE — Telephone Encounter (Signed)
LVM for patient to give [place] a call back to establish with a new PCP.

## 2019-08-25 NOTE — Telephone Encounter (Signed)
Please call and schedule patient for appt to establish with new PCP for refills

## 2020-01-06 ENCOUNTER — Encounter: Payer: Self-pay | Admitting: Family Medicine

## 2020-01-06 ENCOUNTER — Ambulatory Visit (INDEPENDENT_AMBULATORY_CARE_PROVIDER_SITE_OTHER): Payer: BC Managed Care – PPO | Admitting: Family Medicine

## 2020-01-06 VITALS — BP 185/99 | HR 65 | Temp 98.0°F | Wt 205.6 lb

## 2020-01-06 DIAGNOSIS — Z23 Encounter for immunization: Secondary | ICD-10-CM | POA: Diagnosis not present

## 2020-01-06 DIAGNOSIS — I10 Essential (primary) hypertension: Secondary | ICD-10-CM

## 2020-01-06 DIAGNOSIS — N529 Male erectile dysfunction, unspecified: Secondary | ICD-10-CM

## 2020-01-06 MED ORDER — SILDENAFIL CITRATE 20 MG PO TABS
ORAL_TABLET | ORAL | 3 refills | Status: DC
Start: 2020-01-06 — End: 2020-05-04

## 2020-01-06 MED ORDER — AMLODIPINE BESYLATE 10 MG PO TABS: 10.0000 mg | ORAL_TABLET | Freq: Every day | ORAL | 3 refills | Status: DC

## 2020-01-06 MED ORDER — LISINOPRIL-HYDROCHLOROTHIAZIDE 20-12.5 MG PO TABS: ORAL_TABLET | ORAL | 3 refills | Status: DC

## 2020-01-06 NOTE — Assessment & Plan Note (Signed)
Blood pressure is not at goal at for age and co-morbidities.  I recommend restarting amlodipine and lisinopril/hctz.  In addition they were instructed to follow a low sodium diet with regular exercise to help to maintain adequate control of blood pressure.

## 2020-01-06 NOTE — Progress Notes (Signed)
Isaac Bowen - 54 y.o. male MRN [id number]  Date of birth: 1965/04/06  Subjective Chief Complaint  Patient presents with  . Hypertension    HPI Isaac Bowen is a 54 y.o. male here today for follow up of HTN.  In addition to HTN he has a history of sickle cell disease, followed by Dr. Calton Dach at [facility].  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  S/p L shoulder arthroplasty.   He is prescribed amlodipine 10mg  and lisinopril/hctz 20/12.5mg  for management of his HTN.  He has been out of both of these medications for about 1 month.  He had mild headache this morning which resolved but denies any other symptoms related to HTN including chest pain, shortness of breath, ,palpitations, or vision changes.   ROS:  A comprehensive ROS was completed and negative except as noted per HPI  Allergies  Allergen Reactions  . Metoprolol Diarrhea and Other (See Comments)    Chest pain    Past Medical History:  Diagnosis Date  . Hypertension   . 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      Past Surgical History:  Procedure Laterality Date  . orthoscopic      Social History   Socioeconomic History  . Marital status: Single    Spouse name: Not on file  . Number of children: Not on file  . Years of education: Not on file  . Highest education level: Not on file  Occupational History  . Not on file  Tobacco Use  . Smoking status: Never Smoker  . Smokeless tobacco: Never Used  Vaping Use  . Vaping Use: Never used  Substance and Sexual Activity  . Alcohol use: Yes    Alcohol/week: 0.0 standard drinks    Comment: 4/week  . Drug use: Yes    Types: Marijuana  . Sexual activity: Yes    Partners: Female  Other Topics Concern  . Not on file  Social History Narrative  . Not on file   Social Determinants of Health    Financial Resource Strain:   . Difficulty of Paying Living Expenses: Not on file  Food Insecurity:   . Worried About Charity fundraiser in the Last Year: Not on file  . Ran Out of Food in the Last Year: Not on file  Transportation Needs:   . Lack of Transportation (Medical): Not on file  . Lack of Transportation (Non-Medical): Not on file  Physical Activity:   . Days of Exercise per Week: Not on file  . Minutes of Exercise per Session: Not on file  Stress:   . Feeling of Stress : Not on file  Social Connections:   . Frequency of Communication with Friends and Family: Not on file  . Frequency of Social Gatherings with Friends and Family: Not on file  . Attends Religious Services: Not on file  . Active Member of Clubs or Organizations: Not on file  . Attends Archivist Meetings: Not on file  . Marital Status: Not on file    Family History  Problem Relation Age of Onset  . Cancer Mother   . Diabetes Father   . Hyperlipidemia Father   . Hypertension Father   . Stroke Father   . Hypertension Brother     Health Maintenance  Topic Date Due  . Hepatitis C Screening  Never done  . Fecal DNA (Cologuard)  11/09/2018  . INFLUENZA VACCINE  10/03/2019  . TETANUS/TDAP  03/23/2025  . COVID-19 Vaccine  Completed  . HIV Screening  Completed     ----------------------------------------------------------------------------------------------------------------------------------------------------------------------------------------------------------------- Physical Exam BP (!) 185/99 (BP Location: Left Arm, Patient Position: Sitting, Cuff Size: Normal)   Pulse 65   Temp 98 F (36.7 C)   Wt 205 lb 9.6 oz (93.3 kg)   SpO2 99%   BMI 27.13 kg/m   Physical Exam Constitutional:      Appearance: Normal appearance.  HENT:     Head: Normocephalic and atraumatic.  Eyes:     General: No scleral icterus. Cardiovascular:     Rate and Rhythm: Normal rate and regular rhythm.   Pulmonary:     Effort: Pulmonary effort is normal.     Breath sounds: Normal breath sounds.  Musculoskeletal:     Cervical back: Neck supple.  Skin:    General: Skin is warm and dry.  Neurological:     General: No focal deficit present.     Mental Status: He is alert.  Psychiatric:        Mood and Affect: Mood normal.        Behavior: Behavior normal.     ------------------------------------------------------------------------------------------------------------------------------------------------------------------------------------------------------------------- Assessment and Plan  Essential (primary) hypertension Blood pressure is not at goal at for age and co-morbidities.  I recommend restarting amlodipine and lisinopril/hctz.  In addition they were instructed to follow a low sodium diet with regular exercise to help to maintain adequate control of blood pressure.    ED (erectile dysfunction) Sildenafil has worked well for him, will renew rx.  Reviewed increased risk of priapism with history of sickle cell and recommendations for ER care if having prolonged erection >4 hours.     Meds ordered this encounter  Medications  . amLODipine (NORVASC) 10 MG tablet    Sig: Take 1 tablet (10 mg total) by mouth daily.    Dispense:  90 tablet    Refill:  3  . lisinopril-hydrochlorothiazide (ZESTORETIC) 20-12.5 MG tablet    Sig: TAKE 1 TABLET BY MOUTH ONCE DAILY.    Dispense:  90 tablet    Refill:  3  . sildenafil (REVATIO) 20 MG tablet    Sig: Take 2-5 tablets as needed for erectile dysfunction    Dispense:  30 tablet    Refill:  3    Return in about 4 weeks (around 02/03/2020) for HTN.    This visit occurred during the SARS-CoV-2 public health emergency.  Safety protocols were in place, including screening questions prior to the visit, additional usage of staff PPE, and extensive cleaning of exam room while observing appropriate contact time as indicated for disinfecting  solutions.

## 2020-01-06 NOTE — Assessment & Plan Note (Addendum)
Sildenafil has worked well for him, will renew rx.  Reviewed increased risk of priapism with history of sickle cell and recommendations for ER care if having prolonged erection >4 hours.

## 2020-01-06 NOTE — Patient Instructions (Signed)
Great to meet you today! Please restart BP medications.  Follow up in 1 month for BP recheck with me.

## 2020-02-03 ENCOUNTER — Ambulatory Visit: Payer: BC Managed Care – PPO | Admitting: Family Medicine

## 2020-02-04 ENCOUNTER — Ambulatory Visit (INDEPENDENT_AMBULATORY_CARE_PROVIDER_SITE_OTHER): Payer: BC Managed Care – PPO | Admitting: Family Medicine

## 2020-02-04 ENCOUNTER — Other Ambulatory Visit: Payer: Self-pay

## 2020-02-04 ENCOUNTER — Encounter: Payer: Self-pay | Admitting: Family Medicine

## 2020-02-04 DIAGNOSIS — I10 Essential (primary) hypertension: Secondary | ICD-10-CM

## 2020-02-04 MED ORDER — LISINOPRIL-HYDROCHLOROTHIAZIDE 20-12.5 MG PO TABS: 2.0000 | ORAL_TABLET | Freq: Every day | ORAL | 3 refills | Status: DC

## 2020-02-04 NOTE — Assessment & Plan Note (Signed)
BP remains elevated today. He will continue amlodipine and we'll increase lisinopril/hctz to 2 tabs daily.  He should follow a low sodium diet with regular activity

## 2020-02-04 NOTE — Progress Notes (Signed)
Isaac Bowen - 54 y.o. male MRN [id number]  Date of birth: [birth year]  Subjective Chief Complaint  Patient presents with  . Hypertension    HPI Isaac Bowen is a 54 y.o. male here today for follow up of HTN.  He was restarted on amlodipine and lisinopril/hctz at his last visit.  He has had some mild edema since restarting the amlodipine but nothing intolerable.  His BP remains elevated today.  He denies symptoms related to HTN including chest pain, shortness of breath, palpitations, headache or vision changes.  He feels like his diet has been pretty good.    ROS:  A comprehensive ROS was completed and negative except as noted per HPI  Allergies  Allergen Reactions  . Metoprolol Diarrhea and Other (See Comments)    Chest pain    Past Medical History:  Diagnosis Date  . Hypertension   . 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      Past Surgical History:  Procedure Laterality Date  . orthoscopic      Social History   Socioeconomic History  . Marital status: Single    Spouse name: Not on file  . Number of children: Not on file  . Years of education: Not on file  . Highest education level: Not on file  Occupational History  . Not on file  Tobacco Use  . Smoking status: Never Smoker  . Smokeless tobacco: Never Used  Vaping Use  . Vaping Use: Never used  Substance and Sexual Activity  . Alcohol use: Yes    Alcohol/week: 0.0 standard drinks    Comment: 4/week  . Drug use: Yes    Types: Marijuana  . Sexual activity: Yes    Partners: Female  Other Topics Concern  . Not on file  Social History Narrative  . Not on file   Social Determinants of Health   Financial Resource Strain:   . Difficulty of Paying Living Expenses: Not on file  Food Insecurity:   . Worried About Charity fundraiser in the Last Year: Not on file  . Ran Out of Food in the Last Year:  Not on file  Transportation Needs:   . Lack of Transportation (Medical): Not on file  . Lack of Transportation (Non-Medical): Not on file  Physical Activity:   . Days of Exercise per Week: Not on file  . Minutes of Exercise per Session: Not on file  Stress:   . Feeling of Stress : Not on file  Social Connections:   . Frequency of Communication with Friends and Family: Not on file  . Frequency of Social Gatherings with Friends and Family: Not on file  . Attends Religious Services: Not on file  . Active Member of Clubs or Organizations: Not on file  . Attends Archivist Meetings: Not on file  . Marital Status: Not on file    Family History  Problem Relation Age of Onset  . Cancer Mother   . Diabetes Father   . Hyperlipidemia Father   . Hypertension Father   . Stroke Father   . Hypertension Brother     Health Maintenance  Topic Date Due  . Hepatitis C Screening  Never done  . Fecal DNA (Cologuard)  11/09/2018  . TETANUS/TDAP  03/23/2025  . INFLUENZA VACCINE  Completed  . COVID-19 Vaccine  Completed  . HIV Screening  Completed     ----------------------------------------------------------------------------------------------------------------------------------------------------------------------------------------------------------------- Physical  Exam BP (!) 145/94 (BP Location: Left Arm, Patient Position: Sitting, Cuff Size: Large)   Pulse (!) 107   Temp 98.1 F (36.7 C)   Wt 203 lb 6.4 oz (92.3 kg)   SpO2 99%   BMI 26.84 kg/m   Physical Exam Constitutional:      Appearance: Normal appearance.  HENT:     Head: Normocephalic and atraumatic.  Cardiovascular:     Rate and Rhythm: Normal rate and regular rhythm.  Pulmonary:     Effort: Pulmonary effort is normal.     Breath sounds: Normal breath sounds.  Neurological:     General: No focal deficit present.     Mental Status: He is alert.  Psychiatric:        Mood and Affect: Mood normal.         Behavior: Behavior normal.     ------------------------------------------------------------------------------------------------------------------------------------------------------------------------------------------------------------------- Assessment and Plan  Essential (primary) hypertension BP remains elevated today. He will continue amlodipine and we'll increase lisinopril/hctz to 2 tabs daily.  He should follow a low sodium diet with regular activity   Meds ordered this encounter  Medications  . lisinopril-hydrochlorothiazide (ZESTORETIC) 20-12.5 MG tablet    Sig: Take 2 tablets by mouth daily.    Dispense:  180 tablet    Refill:  3    Return in about 3 months (around 05/04/2020) for HTN.    This visit occurred during the SARS-CoV-2 public health emergency.  Safety protocols were in place, including screening questions prior to the visit, additional usage of staff PPE, and extensive cleaning of exam room while observing appropriate contact time as indicated for disinfecting solutions.

## 2020-02-04 NOTE — Patient Instructions (Signed)
Great to see you today! Let's try increasing lisinopril/hctz to 2 tablets daily.  See me again in about 3 months.

## 2020-05-04 ENCOUNTER — Encounter: Payer: Self-pay | Admitting: Family Medicine

## 2020-05-04 ENCOUNTER — Other Ambulatory Visit: Payer: Self-pay

## 2020-05-04 ENCOUNTER — Ambulatory Visit (INDEPENDENT_AMBULATORY_CARE_PROVIDER_SITE_OTHER): Payer: BC Managed Care – PPO | Admitting: Family Medicine

## 2020-05-04 VITALS — BP 112/80 | HR 93 | Ht 73.0 in | Wt 203.4 lb

## 2020-05-04 DIAGNOSIS — N529 Male erectile dysfunction, unspecified: Secondary | ICD-10-CM

## 2020-05-04 DIAGNOSIS — N1831 Chronic kidney disease, stage 3a: Secondary | ICD-10-CM | POA: Diagnosis not present

## 2020-05-04 DIAGNOSIS — Z1159 Encounter for screening for other viral diseases: Secondary | ICD-10-CM | POA: Diagnosis not present

## 2020-05-04 DIAGNOSIS — I10 Essential (primary) hypertension: Secondary | ICD-10-CM

## 2020-05-04 DIAGNOSIS — Z1211 Encounter for screening for malignant neoplasm of colon: Secondary | ICD-10-CM | POA: Diagnosis not present

## 2020-05-04 MED ORDER — SILDENAFIL CITRATE 20 MG PO TABS
ORAL_TABLET | ORAL | 11 refills | Status: DC
Start: 2020-05-04 — End: 2020-05-04

## 2020-05-04 MED ORDER — SILDENAFIL CITRATE 20 MG PO TABS
ORAL_TABLET | ORAL | 11 refills | Status: DC
Start: 2020-05-04 — End: 2021-05-09

## 2020-05-04 NOTE — Progress Notes (Signed)
Isaac Bowen - 55 y.o. male MRN [id number]  Date of birth: May 16, 1965  Subjective Chief Complaint  Patient presents with   Hypertension    HPI Isaac Bowen is a 55 y.o. male here today for follow up of HTN.  Lisinopril/hctz increased at last visit.  He has done well with this change.  He denies side effects related to medication change.  He has not had chest pain, shortness of breath, palpitations, headache or vision changes.    He is also requesting renewal of his sildenafil.  This is working well without significant side effects.    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      Past Surgical History:  Procedure Laterality Date   orthoscopic      Social History   Socioeconomic History   Marital status: Single    Spouse name: Not on file   Number of children: Not on file   Years of education: Not on file   Highest education level: Not on file  Occupational History   Not on file  Tobacco Use   Smoking status: Never Smoker   Smokeless tobacco: Never Used  Vaping Use   Vaping Use: Never used  Substance and Sexual Activity   Alcohol use: Yes    Alcohol/week: 0.0 standard drinks    Comment: 4/week   Drug use: Yes    Types: Marijuana   Sexual activity: Yes    Partners: Female  Other Topics Concern   Not on file  Social History Narrative   Not on file   Social Determinants of Health   Financial Resource Strain: Not on file  Food Insecurity: Not on file  Transportation Needs: Not on file  Physical Activity: Not on file  Stress: Not on file  Social Connections: Not on file    Family History  Problem  Relation Age of Onset   Cancer Mother    Diabetes Father    Hyperlipidemia Father    Hypertension Father    Stroke Father    Hypertension Brother     Health Maintenance  Topic Date Due   Hepatitis C Screening  Never done   Fecal DNA (Cologuard)  11/09/2018   TETANUS/TDAP  03/23/2025   INFLUENZA VACCINE  Completed   COVID-19 Vaccine  Completed   HIV Screening  Completed   HPV VACCINES  Aged Out     ----------------------------------------------------------------------------------------------------------------------------------------------------------------------------------------------------------------- Physical Exam BP 112/80 (BP Location: Left Arm, Patient Position: Sitting, Cuff Size: Normal)    Pulse 93    Ht 6\' 1"  (1.854 m)    Wt 203 lb 6.4 oz (92.3 kg)    SpO2 98%    BMI 26.84 kg/m   Physical Exam Constitutional:      Appearance: Normal appearance.  HENT:     Head: Normocephalic and atraumatic.  Cardiovascular:     Rate and Rhythm: Normal rate and regular rhythm.  Pulmonary:     Effort: Pulmonary effort is normal.     Breath sounds: Normal breath sounds.  Musculoskeletal:     Cervical back: Neck supple.  Neurological:     General: No focal deficit present.  Mental Status: He is alert.  Psychiatric:        Mood and Affect: Mood normal.        Behavior: Behavior normal.     ------------------------------------------------------------------------------------------------------------------------------------------------------------------------------------------------------------------- Assessment and Plan  Essential (primary) hypertension Blood pressure is at goal at for age and co-morbidities.  I recommend continuation of lisinopril/hctz and amlodipine at current dose.  In addition they were instructed to follow a low sodium diet with regular exercise to help to maintain adequate control of blood pressure.    CKD (chronic kidney disease), stage  III Update renal function.   ED (erectile dysfunction) Sildenafil renewed.  Reminded of precautions with medication.    Screening for colon cancer Cologuard ordered   Meds ordered this encounter  Medications   DISCONTD: sildenafil (REVATIO) 20 MG tablet    Sig: Take 2-5 tablets as needed for erectile dysfunction    Dispense:  30 tablet    Refill:  11   sildenafil (REVATIO) 20 MG tablet    Sig: Take 2-5 tablets as needed for erectile dysfunction    Dispense:  30 tablet    Refill:  11    No follow-ups on file.    This visit occurred during the SARS-CoV-2 public health emergency.  Safety protocols were in place, including screening questions prior to the visit, additional usage of staff PPE, and extensive cleaning of exam room while observing appropriate contact time as indicated for disinfecting solutions.

## 2020-05-04 NOTE — Assessment & Plan Note (Signed)
Sildenafil renewed.  Reminded of precautions with medication.

## 2020-05-04 NOTE — Assessment & Plan Note (Signed)
Cologuard ordered

## 2020-05-04 NOTE — Patient Instructions (Addendum)
Great to see you! Have labs completed. Send Cologuard back in.  Continue current medications See me again in 6 months.

## 2020-05-04 NOTE — Assessment & Plan Note (Signed)
Blood pressure is at goal at for age and co-morbidities.  I recommend continuation of lisinopril/hctz and amlodipine at current dose.  In addition they were instructed to follow a low sodium diet with regular exercise to help to maintain adequate control of blood pressure.

## 2020-05-04 NOTE — Assessment & Plan Note (Signed)
Update renal function.  

## 2020-05-05 LAB — COMPLETE METABOLIC PANEL WITH GFR
AG Ratio: 1.4 (calc) (ref 1.0–2.5)
ALT: 11 U/L (ref 9–46)
AST: 15 U/L (ref 10–35)
Albumin: 4.2 g/dL (ref 3.6–5.1)
Alkaline phosphatase (APISO): 64 U/L (ref 35–144)
BUN/Creatinine Ratio: 12 (calc) (ref 6–22)
BUN: 21 mg/dL (ref 7–25)
CO2: 29 mmol/L (ref 20–32)
Calcium: 8.9 mg/dL (ref 8.6–10.3)
Chloride: 103 mmol/L (ref 98–110)
Creat: 1.69 mg/dL — ABNORMAL HIGH (ref 0.70–1.33)
GFR, Est African American: 52 mL/min/{1.73_m2} — ABNORMAL LOW (ref >=60)
GFR, Est Non African American: 45 mL/min/{1.73_m2} — ABNORMAL LOW (ref >=60)
Globulin: 2.9 g/dL (calc) (ref 1.9–3.7)
Glucose, Bld: 91 mg/dL (ref 65–99)
Potassium: 3.7 mmol/L (ref 3.5–5.3)
Sodium: 141 mmol/L (ref 135–146)
Total Bilirubin: 1.4 mg/dL — ABNORMAL HIGH (ref 0.2–1.2)
Total Protein: 7.1 g/dL (ref 6.1–8.1)

## 2020-05-05 LAB — CBC
HCT: 36.4 % — ABNORMAL LOW (ref 38.5–50.0)
Hemoglobin: 12.2 g/dL — ABNORMAL LOW (ref 13.2–17.1)
MCH: 26.9 pg — ABNORMAL LOW (ref 27.0–33.0)
MCHC: 33.5 g/dL (ref 32.0–36.0)
MCV: 80.2 fL (ref 80.0–100.0)
Platelets: 230 10*3/uL (ref 140–400)
RBC: 4.54 10*6/uL (ref 4.20–5.80)
RDW: 16.4 % — ABNORMAL HIGH (ref 11.0–15.0)
WBC: 6.2 10*3/uL (ref 3.8–10.8)

## 2020-05-05 LAB — HEPATITIS C ANTIBODY
Hepatitis C Ab: NONREACTIVE
SIGNAL TO CUT-OFF: 0.01 (ref <1.00)

## 2020-05-11 ENCOUNTER — Telehealth: Payer: Self-pay | Admitting: Neurology

## 2020-05-11 NOTE — Telephone Encounter (Signed)
Prior Authorization for sildenafil submitted via covermymeds. Awaiting response. Your information has been submitted to [place]. To check for an updated outcome later, reopen this PA request from your dashboard.  If [employer] has not responded to your request within 24 hours, contact [place] at [phone]. If you think there may be a problem with your PA request, use our live chat feature at the bottom right.

## 2020-05-16 LAB — COLOGUARD: Cologuard: NEGATIVE

## 2020-05-21 LAB — EXTERNAL GENERIC LAB PROCEDURE: COLOGUARD: NEGATIVE

## 2020-05-23 ENCOUNTER — Encounter: Payer: Self-pay | Admitting: Family Medicine

## 2020-12-08 ENCOUNTER — Other Ambulatory Visit: Payer: Self-pay | Admitting: Family Medicine

## 2020-12-08 DIAGNOSIS — I10 Essential (primary) hypertension: Secondary | ICD-10-CM

## 2020-12-21 IMAGING — DX DG LUMBAR SPINE COMPLETE 4+V
5 series · 5 of 5 positions shown · non-contrast
Comparison: None.

CLINICAL DATA: Pain following twisting injury

EXAM:
LUMBAR SPINE - COMPLETE 4+ VIEW

[l-spine ap]
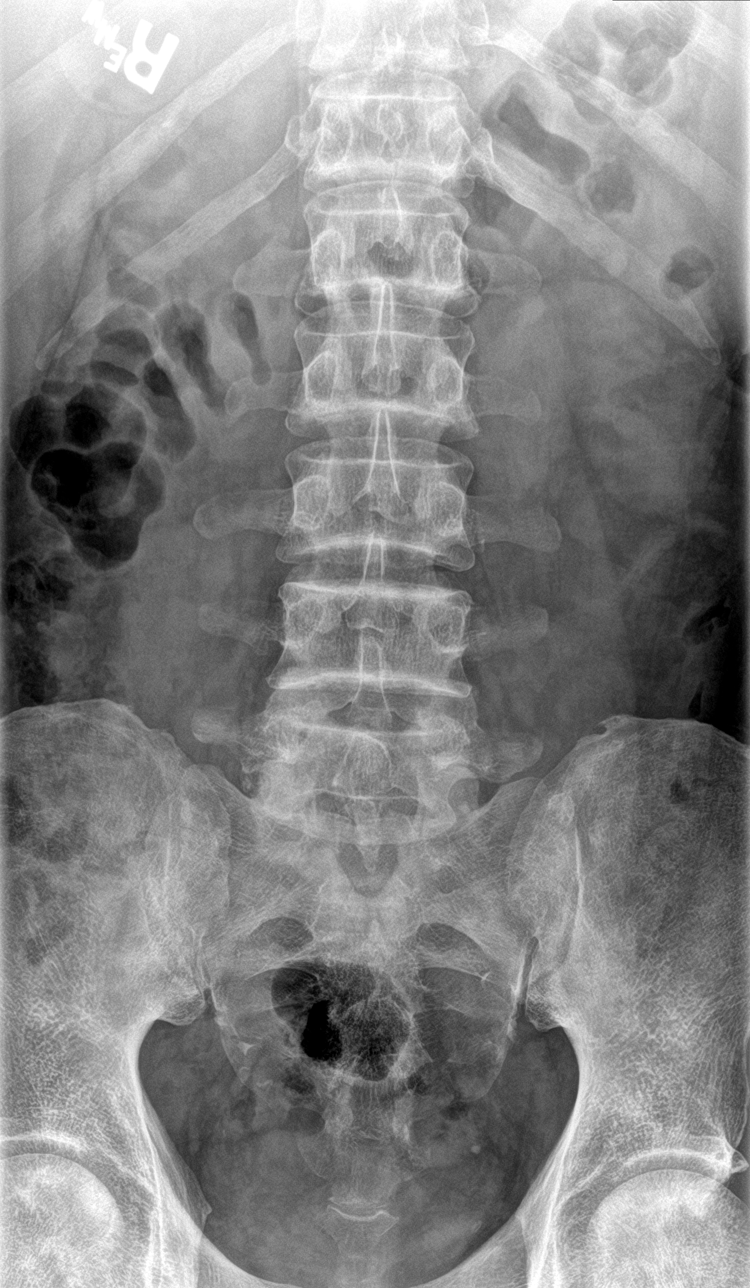

[l-spine obl (1 of 2)]
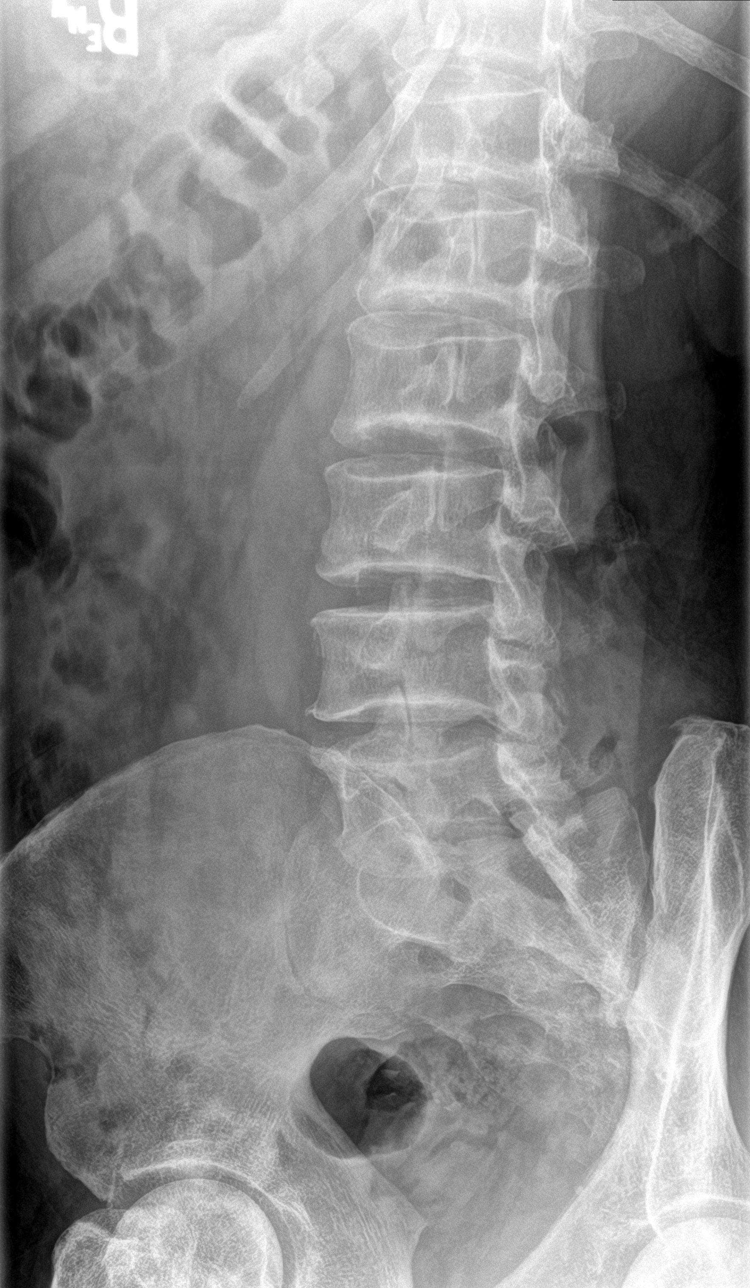

[l-spine obl (2 of 2)]
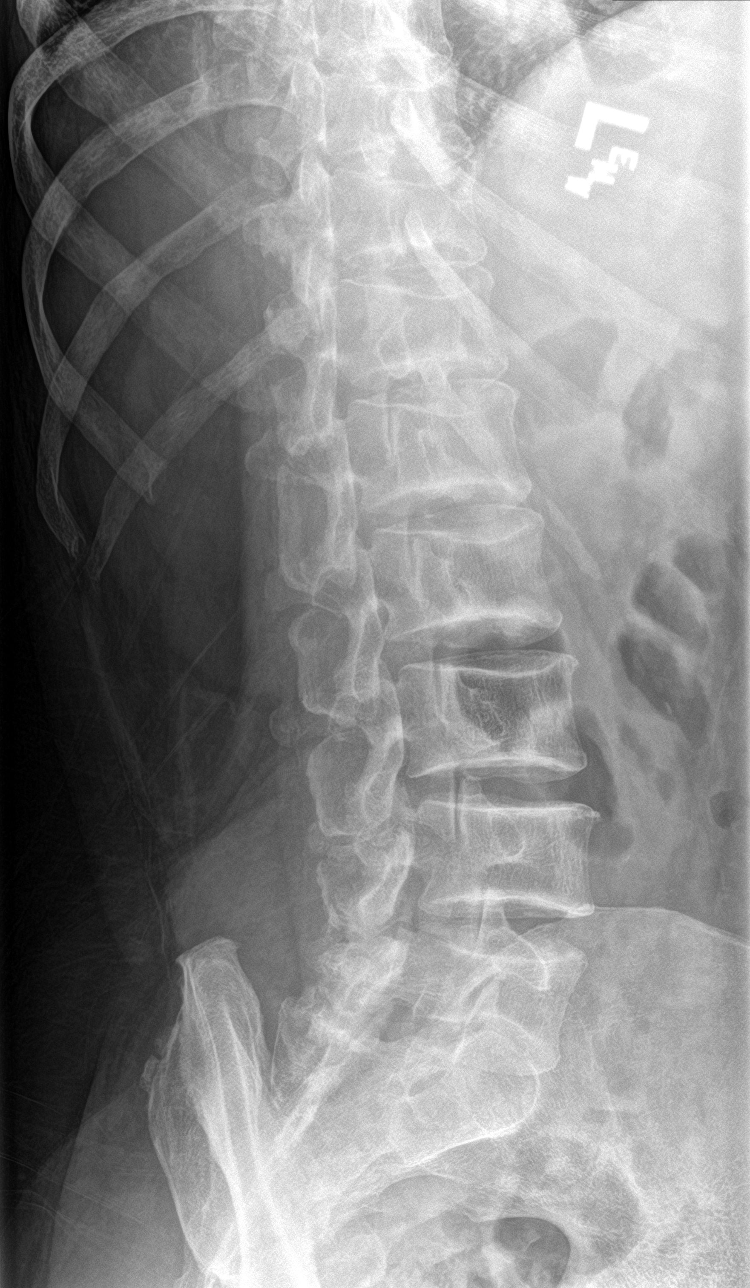

[l-spine lat]
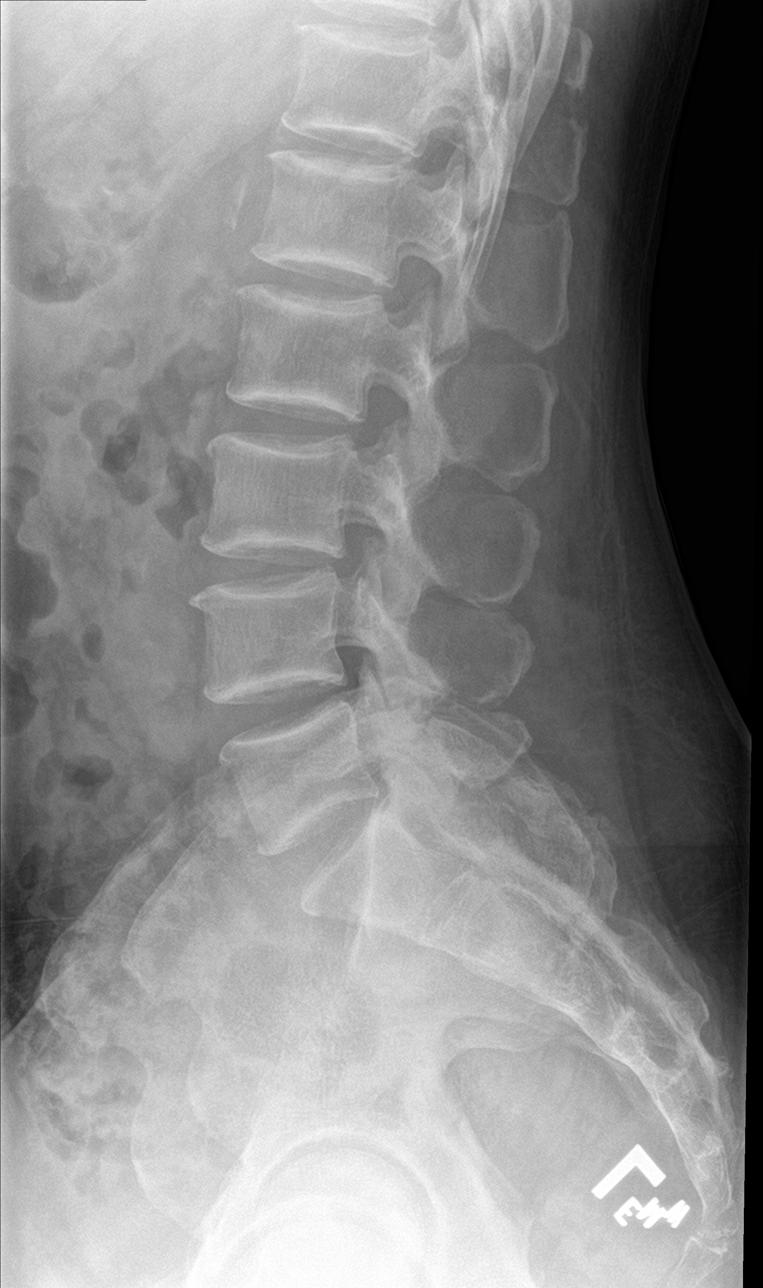

[l-spine spot]
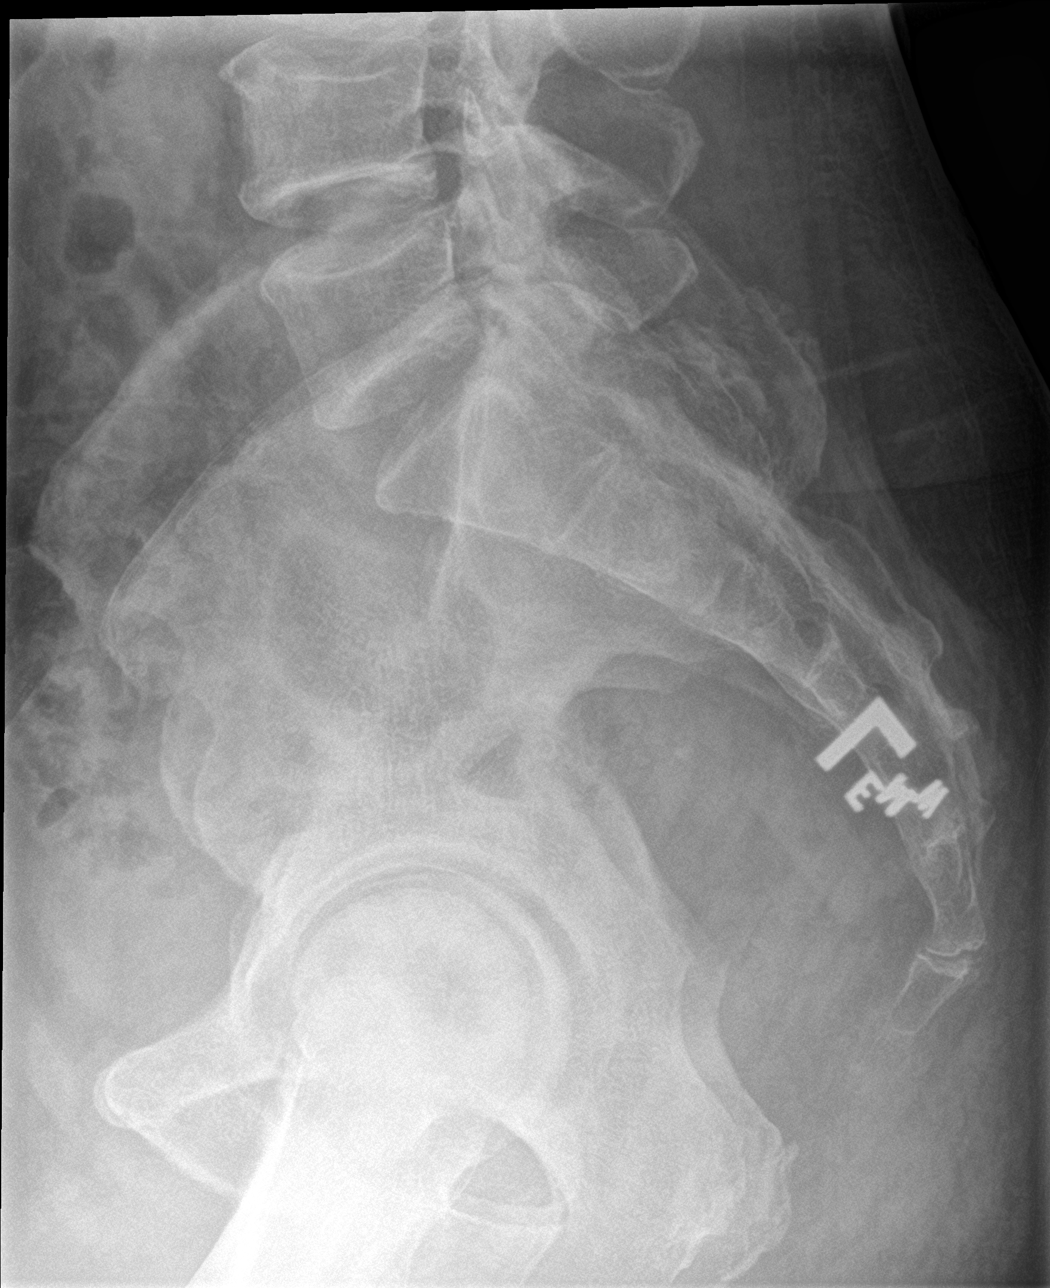

[5 of 5 positions shown; findings below may reference images not displayed]

FINDINGS: Frontal, lateral, spot lumbosacral lateral, and bilateral oblique
views were obtained. There are 5 non-rib-bearing lumbar type
vertebral bodies. There is no fracture or spondylolisthesis. Disc
spaces appear unremarkable. There is no appreciable facet
arthropathy. There is aortic atherosclerosis.
IMPRESSION: No fracture or spondylolisthesis.  No appreciable arthropathy.

Aortic Atherosclerosis (3ZP9E-Z68.8).

## 2021-02-20 ENCOUNTER — Other Ambulatory Visit: Payer: Self-pay | Admitting: Family Medicine

## 2021-02-20 DIAGNOSIS — I10 Essential (primary) hypertension: Secondary | ICD-10-CM

## 2021-05-09 ENCOUNTER — Other Ambulatory Visit: Payer: Self-pay | Admitting: Family Medicine

## 2021-09-18 ENCOUNTER — Other Ambulatory Visit: Payer: Self-pay | Admitting: Family Medicine

## 2021-09-18 NOTE — Telephone Encounter (Signed)
Pls contact pt to schedule office visit. Last seen 05/2020. Unable to provide refills. Thanks

## 2021-09-18 NOTE — Telephone Encounter (Signed)
Patient scheduled for 10/17/21.

## 2021-10-17 ENCOUNTER — Encounter: Payer: Self-pay | Admitting: Family Medicine

## 2021-10-17 ENCOUNTER — Ambulatory Visit (INDEPENDENT_AMBULATORY_CARE_PROVIDER_SITE_OTHER): Payer: Medicare HMO | Admitting: Family Medicine

## 2021-10-17 VITALS — BP 127/81 | HR 71 | Ht 73.0 in | Wt 216.0 lb

## 2021-10-17 DIAGNOSIS — N529 Male erectile dysfunction, unspecified: Secondary | ICD-10-CM | POA: Diagnosis not present

## 2021-10-17 DIAGNOSIS — E559 Vitamin D deficiency, unspecified: Secondary | ICD-10-CM | POA: Diagnosis not present

## 2021-10-17 DIAGNOSIS — Z125 Encounter for screening for malignant neoplasm of prostate: Secondary | ICD-10-CM

## 2021-10-17 DIAGNOSIS — I10 Essential (primary) hypertension: Secondary | ICD-10-CM

## 2021-10-17 DIAGNOSIS — Z13220 Encounter for screening for lipoid disorders: Secondary | ICD-10-CM

## 2021-10-17 DIAGNOSIS — N1831 Chronic kidney disease, stage 3a: Secondary | ICD-10-CM

## 2021-10-17 MED ORDER — AMLODIPINE BESYLATE 10 MG PO TABS: 10.0000 mg | ORAL_TABLET | Freq: Every day | ORAL | 3 refills | Status: AC

## 2021-10-17 MED ORDER — SILDENAFIL CITRATE 20 MG PO TABS
ORAL_TABLET | ORAL | 5 refills | Status: AC
Start: 2021-10-17 — End: ?

## 2021-10-17 MED ORDER — LISINOPRIL-HYDROCHLOROTHIAZIDE 20-12.5 MG PO TABS: 2.0000 | ORAL_TABLET | Freq: Every day | ORAL | 1 refills | Status: DC

## 2021-10-17 NOTE — Patient Instructions (Signed)
Continue current medications.  We'll be in touch with lab results.

## 2021-10-17 NOTE — Assessment & Plan Note (Signed)
Blood pressure remains well controlled with current medications.  Recommend continuation at current strength.

## 2021-10-17 NOTE — Assessment & Plan Note (Signed)
Update renal function today. 

## 2021-10-17 NOTE — Progress Notes (Signed)
[place] Isaac Bowen - 56 y.o. male MRN [id number]  Date of birth: 12-04-1965  Subjective Chief Complaint  Patient presents with   Hypertension    HPI Isaac Bowen is a 56 y.o. male here today for follow up visit.   Reports that he is doing well at this time.    Blood pressures remain well controlled with lisinopril/hydrochlorothiazide as well as amlodipine.  No issues with tolerance.  He has not had any symptoms related to hypertension including chest pain, shortness of breath, palpitations, headaches or vision changes.  Is requesting renewal of sildenafil.  This works well for him without side effects.  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      Past Surgical History:  Procedure Laterality Date   orthoscopic      Social History   Socioeconomic History   Marital status: Single    Spouse name: Not on file   Number of children: Not on file   Years of education: Not on file   Highest education level: Not on file  Occupational History   Not on file  Tobacco Use   Smoking status: Never   Smokeless tobacco: Never  Vaping Use   Vaping Use: Never used  Substance and Sexual Activity   Alcohol use: Yes    Alcohol/week: 0.0 standard drinks of alcohol    Comment: 4/week   Drug use: Yes    Types: Marijuana   Sexual activity: Yes    Partners: Female  Other Topics Concern   Not on file  Social History Narrative   Not on file   Social Determinants of Health   Financial Resource Strain: Not on file  Food Insecurity: Not on file  Transportation Needs: Not on file  Physical Activity: Not on file  Stress: Not on file  Social Connections:  Not on file    Family History  Problem Relation Age of Onset   Cancer Mother    Diabetes Father    Hyperlipidemia Father    Hypertension Father    Stroke Father    Hypertension Brother     Health Maintenance  Topic Date Due   Zoster Vaccines- Shingrix (1 of 2) 03/04/2022 (Originally 05/09/2015)   INFLUENZA VACCINE  06/02/2022 (Originally 10/02/2021)   Fecal DNA (Cologuard)  05/17/2023   TETANUS/TDAP  03/23/2025   COVID-19 Vaccine  Completed   Hepatitis C Screening  Completed   HIV Screening  Completed   HPV VACCINES  Aged Out     ----------------------------------------------------------------------------------------------------------------------------------------------------------------------------------------------------------------- Physical Exam BP 127/81 (BP Location: Left Arm, Patient Position: Sitting, Cuff Size: Normal)   Pulse 71   Ht '6\' 1"'$  (1.854 m)   Wt 216 lb (98 kg)   SpO2 98%   BMI 28.50 kg/m   Physical Exam Constitutional:      Appearance: Normal appearance.  Eyes:     General: No scleral icterus. Cardiovascular:     Rate and Rhythm: Normal rate and regular rhythm.  Pulmonary:     Effort: Pulmonary effort is normal.     Breath sounds: Normal breath sounds.  Musculoskeletal:     Cervical back: Neck supple.  Neurological:  General: No focal deficit present.     Mental Status: He is alert.  Psychiatric:        Mood and Affect: Mood normal.        Behavior: Behavior normal.     ------------------------------------------------------------------------------------------------------------------------------------------------------------------------------------------------------------------- Assessment and Plan  Essential (primary) hypertension Blood pressure remains well controlled with current medications.  Recommend continuation at current strength.  CKD (chronic kidney disease), stage III Update renal function today.  ED (erectile  dysfunction) He is doing well with sildenafil.  Continue this at current strength.   Meds ordered this encounter  Medications   sildenafil (REVATIO) 20 MG tablet    Sig: TAKE TWO TO FIVE TABLETS BY MOUTH DAILY AS NEEDED FOR ERECTILE DYSFUNCTION    Dispense:  30 tablet    Refill:  5   lisinopril-hydrochlorothiazide (ZESTORETIC) 20-12.5 MG tablet    Sig: Take 2 tablets by mouth daily.    Dispense:  180 tablet    Refill:  1   amLODipine (NORVASC) 10 MG tablet    Sig: Take 1 tablet (10 mg total) by mouth daily.    Dispense:  90 tablet    Refill:  3    Return in about 6 months (around 04/19/2022) for HTN.    This visit occurred during the SARS-CoV-2 public health emergency.  Safety protocols were in place, including screening questions prior to the visit, additional usage of staff PPE, and extensive cleaning of exam room while observing appropriate contact time as indicated for disinfecting solutions.

## 2021-10-17 NOTE — Assessment & Plan Note (Signed)
He is doing well with sildenafil.  Continue this at current strength.

## 2021-10-18 LAB — LIPID PANEL W/REFLEX DIRECT LDL
Cholesterol: 150 mg/dL (ref <200)
HDL: 61 mg/dL (ref >=40)
LDL Cholesterol (Calc): 77 mg/dL (calc)
Non-HDL Cholesterol (Calc): 89 mg/dL (calc) (ref <130)
Total CHOL/HDL Ratio: 2.5 (calc) (ref <5.0)
Triglycerides: 43 mg/dL (ref <150)

## 2021-10-18 LAB — COMPLETE METABOLIC PANEL WITH GFR
AG Ratio: 2 (calc) (ref 1.0–2.5)
ALT: 12 U/L (ref 9–46)
AST: 13 U/L (ref 10–35)
Albumin: 4.5 g/dL (ref 3.6–5.1)
Alkaline phosphatase (APISO): 54 U/L (ref 35–144)
BUN/Creatinine Ratio: 9 (calc) (ref 6–22)
BUN: 12 mg/dL (ref 7–25)
CO2: 30 mmol/L (ref 20–32)
Calcium: 9 mg/dL (ref 8.6–10.3)
Chloride: 104 mmol/L (ref 98–110)
Creat: 1.41 mg/dL — ABNORMAL HIGH (ref 0.70–1.30)
Globulin: 2.2 g/dL (calc) (ref 1.9–3.7)
Glucose, Bld: 88 mg/dL (ref 65–139)
Potassium: 4.3 mmol/L (ref 3.5–5.3)
Sodium: 140 mmol/L (ref 135–146)
Total Bilirubin: 1 mg/dL (ref 0.2–1.2)
Total Protein: 6.7 g/dL (ref 6.1–8.1)
eGFR: 58 mL/min/{1.73_m2} — ABNORMAL LOW (ref >=60)

## 2021-10-18 LAB — CBC WITH DIFFERENTIAL/PLATELET
Absolute Monocytes: 293 cells/uL (ref 200–950)
Basophils Absolute: 32 cells/uL (ref 0–200)
Basophils Relative: 0.7 %
Eosinophils Absolute: 81 cells/uL (ref 15–500)
Eosinophils Relative: 1.8 %
HCT: 37.9 % — ABNORMAL LOW (ref 38.5–50.0)
Hemoglobin: 12.4 g/dL — ABNORMAL LOW (ref 13.2–17.1)
Lymphs Abs: 1413 cells/uL (ref 850–3900)
MCH: 27.6 pg (ref 27.0–33.0)
MCHC: 32.7 g/dL (ref 32.0–36.0)
MCV: 84.2 fL (ref 80.0–100.0)
Monocytes Relative: 6.5 %
Neutro Abs: 2682 cells/uL (ref 1500–7800)
Neutrophils Relative %: 59.6 %
Platelets: 148 10*3/uL (ref 140–400)
RBC: 4.5 10*6/uL (ref 4.20–5.80)
RDW: 15 % (ref 11.0–15.0)
Total Lymphocyte: 31.4 %
WBC: 4.5 10*3/uL (ref 3.8–10.8)

## 2021-10-18 LAB — VITAMIN D 25 HYDROXY (VIT D DEFICIENCY, FRACTURES): Vit D, 25-Hydroxy: 31 ng/mL (ref 30–100)

## 2021-10-18 LAB — PSA: PSA: 1.34 ng/mL (ref <=4.00)

## 2021-10-24 ENCOUNTER — Encounter: Payer: Self-pay | Admitting: General Practice

## 2021-11-08 ENCOUNTER — Telehealth: Payer: Self-pay

## 2021-11-08 NOTE — Telephone Encounter (Addendum)
Initiated Prior authorization YEM:[id number] Citrate '20MG'$  tablets Via: Covermymeds Case/Key:[id number] Status: denied  as of 11/08/21 Reason:Current plan approved criteria states: Your plan only covers this drug when it is used for certain health conditions. Covered uses are for pulmonary arterial hypertension (PAH) and secondary Raynaud's phenomenon. Your plan does not cover this drug for your health condition that your doctor told us you have. We reviewed the information we had. Your request has been denied. Your doctor can send [place] any new or missing information for [place] to review. For this drug, you may have to meet other criteria. You can request the drug policy for more details. You can also request other plan documents for your review. Additional coverage criteria may apply, please review policy or plan documents for full requirements. Notified Pt via: Mychart

## 2022-04-08 ENCOUNTER — Ambulatory Visit: Payer: Medicare HMO | Admitting: Family Medicine

## 2022-04-19 ENCOUNTER — Ambulatory Visit: Payer: Medicare HMO | Admitting: Family Medicine

## 2022-05-01 ENCOUNTER — Telehealth: Payer: Self-pay | Admitting: Family Medicine

## 2022-05-01 NOTE — Telephone Encounter (Signed)
Called patient to schedule Medicare Annual Wellness Visit (AWV). Left message for patient to call back and schedule Medicare Annual Wellness Visit (AWV).  Last date of AWV: Never  Please schedule an appointment at any time with Luetta Nutting, DO.  If any questions, please contact me at [phone].  Thank you ,  Lin Givens Patient Access Advocate II Direct Dial: [phone]

## 2022-05-02 NOTE — Telephone Encounter (Signed)
Called spoke with patient his appointment has been scheduled for 05-07-2022 with Dr. Zigmund Daniel

## 2022-05-07 ENCOUNTER — Encounter: Payer: Self-pay | Admitting: Family Medicine

## 2022-05-07 ENCOUNTER — Ambulatory Visit (INDEPENDENT_AMBULATORY_CARE_PROVIDER_SITE_OTHER): Payer: Medicare HMO | Admitting: Family Medicine

## 2022-05-07 VITALS — BP 112/74 | HR 75 | Ht 73.0 in | Wt 214.0 lb

## 2022-05-07 DIAGNOSIS — I10 Essential (primary) hypertension: Secondary | ICD-10-CM | POA: Diagnosis not present

## 2022-05-07 DIAGNOSIS — N1831 Chronic kidney disease, stage 3a: Secondary | ICD-10-CM | POA: Diagnosis not present

## 2022-05-07 NOTE — Progress Notes (Signed)
Isaac Bowen - 57 y.o. male MRN [id number]  Date of birth: [birth year]  Subjective Chief Complaint  Patient presents with   Hypertension    HPI Isaac Bowen is a 57 year old male here today for follow-up visit.  He reports he is doing quite well at this time.  Denies new concerns today.  Continues on combination lisinopril with hydrochlorothiazide and amlodipine for management of hypertension.  His blood pressure is well-controlled at this time.  He has not had symptoms related to his hypertension including chest pain, shortness of breath, palpitations, headaches or vision changes.  He is staying pretty active.  Feels like diet has gotten better.  Weight is down a few pounds since last visit.  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      Past Surgical History:  Procedure Laterality Date   orthoscopic      Social History   Socioeconomic History   Marital status: Single    Spouse name: Not on file   Number of children: Not on file   Years of education: Not on file   Highest education level: Not on file  Occupational History   Not on file  Tobacco Use   Smoking status: Never   Smokeless tobacco: Never  Vaping Use   Vaping Use: Never used  Substance and Sexual Activity   Alcohol use: Yes    Alcohol/week: 0.0 standard drinks of alcohol    Comment: 4/week   Drug use: Yes    Types: Marijuana   Sexual activity: Yes    Partners: Female  Other Topics Concern   Not on file  Social History Narrative   Not on file   Social Determinants of Health   Financial Resource Strain: Not on file  Food Insecurity: Not on file  Transportation Needs:  Not on file  Physical Activity: Not on file  Stress: Not on file  Social Connections: Not on file    Family History  Problem Relation Age of Onset   Cancer Mother    Diabetes Father    Hyperlipidemia Father    Hypertension Father    Stroke Father    Hypertension Brother     Health Maintenance  Topic Date Due   Zoster Vaccines- Shingrix (1 of 2) 08/07/2022 (Originally 05/09/2015)   Medicare Annual Wellness (AWV)  09/06/2022 (Originally [birth year])   Fecal DNA (Cologuard)  05/17/2023   DTaP/Tdap/Td (2 - Td or Tdap) 03/23/2025   INFLUENZA VACCINE  Completed   COVID-19 Vaccine  Completed   Hepatitis C Screening  Completed   HIV Screening  Completed   HPV VACCINES  Aged Out     ----------------------------------------------------------------------------------------------------------------------------------------------------------------------------------------------------------------- Physical Exam BP 112/74 (BP Location: Left Arm, Patient Position: Sitting, Cuff Size: Large)   Pulse 75   Ht '6\' 1"'$  (1.854 m)   Wt 214 lb (97.1 kg)   SpO2 100%   BMI 28.23 kg/m   Physical Exam Constitutional:      Appearance: Normal appearance.  Eyes:     General: No scleral icterus. Cardiovascular:     Rate and Rhythm: Normal rate and regular rhythm.  Pulmonary:     Effort: Pulmonary effort  is normal.     Breath sounds: Normal breath sounds.  Musculoskeletal:     Cervical back: Neck supple.  Neurological:     Mental Status: He is alert.  Psychiatric:        Mood and Affect: Mood normal.        Behavior: Behavior normal.     ------------------------------------------------------------------------------------------------------------------------------------------------------------------------------------------------------------------- Assessment and Plan  Essential (primary) hypertension Blood pressure is well-controlled at this time.  Recommend continuation of current medications for  management of his hypertension.  Encouraged continued dietary change and weight loss efforts.  CKD (chronic kidney disease), stage III Lab Results  Component Value Date   CREATININE 1.41 (H) 10/17/2021   BUN 12 10/17/2021   NA 140 10/17/2021   K 4.3 10/17/2021   CL 104 10/17/2021   CO2 30 10/17/2021  Renal function has remained stable.    No orders of the defined types were placed in this encounter.   Return in about 6 months (around 11/07/2022) for Annual exam/Fasting labs.    This visit occurred during the SARS-CoV-2 public health emergency.  Safety protocols were in place, including screening questions prior to the visit, additional usage of staff PPE, and extensive cleaning of exam room while observing appropriate contact time as indicated for disinfecting solutions.

## 2022-05-07 NOTE — Assessment & Plan Note (Signed)
Lab Results  Component Value Date   CREATININE 1.41 (H) 10/17/2021   BUN 12 10/17/2021   NA 140 10/17/2021   K 4.3 10/17/2021   CL 104 10/17/2021   CO2 30 10/17/2021  Renal function has remained stable.

## 2022-05-07 NOTE — Assessment & Plan Note (Signed)
Blood pressure is well-controlled at this time.  Recommend continuation of current medications for management of his hypertension.  Encouraged continued dietary change and weight loss efforts.

## 2022-05-21 ENCOUNTER — Ambulatory Visit (INDEPENDENT_AMBULATORY_CARE_PROVIDER_SITE_OTHER): Payer: Medicare HMO | Admitting: Family Medicine

## 2022-05-21 ENCOUNTER — Encounter: Payer: Self-pay | Admitting: Family Medicine

## 2022-05-21 VITALS — BP 123/72 | HR 93 | Ht 73.0 in | Wt 214.0 lb

## 2022-05-21 DIAGNOSIS — I10 Essential (primary) hypertension: Secondary | ICD-10-CM | POA: Diagnosis not present

## 2022-05-21 DIAGNOSIS — Z0000 Encounter for general adult medical examination without abnormal findings: Secondary | ICD-10-CM | POA: Diagnosis not present

## 2022-05-21 DIAGNOSIS — D5740 Sickle-cell thalassemia without crisis: Secondary | ICD-10-CM

## 2022-05-21 DIAGNOSIS — J154 Pneumonia due to other streptococci: Secondary | ICD-10-CM

## 2022-05-21 DIAGNOSIS — I272 Pulmonary hypertension, unspecified: Secondary | ICD-10-CM

## 2022-05-21 MED ORDER — FOLIC ACID 1 MG PO TABS: 2.0000 mg | ORAL_TABLET | Freq: Every day | ORAL | 4 refills | Status: AC

## 2022-05-21 NOTE — Progress Notes (Signed)
Medicare Annual Wellness Visit  05/21/2022  Subjective:  Isaac Bowen is a 57 y.o. male primary care patient of Luetta Nutting, DO who had a Medicare Annual Wellness Visit today via telephone. Isaac Bowen is Working part time and lives with their spouse. Isaac Bowen. Isaac Bowen reports that Isaac Bowen is socially active and does interact with friends/family regularly. Isaac Bowen is moderately physically active.  Patient Care Team: Luetta Nutting, DO as PCP - General (Family Medicine)     10/03/2016    8:19 AM 05/08/2016    8:26 AM 01/29/2016    1:51 PM 09/21/2014    9:08 AM  Advanced Directives  Does Patient Have a Medical Advance Directive? No No No No  Would patient like information on creating a medical advance directive?  No - Patient declined  No - patient declined information    Hospital Utilization Over the Past 12 Months: # of hospitalizations or ER visits: 0 # of surgeries: 0  Review of Systems    Patient reports that his overall health is unchanged compared to last year.  Review of Systems  Constitutional:  Negative for chills, fever, malaise/fatigue and weight loss.  HENT:  Negative for congestion, ear pain and sore throat.   Eyes:  Negative for blurred vision, double vision and pain.  Respiratory:  Negative for cough and shortness of breath.   Cardiovascular:  Negative for chest pain and palpitations.  Gastrointestinal:  Negative for abdominal pain, blood in stool, constipation, heartburn and nausea.  Genitourinary:  Negative for dysuria and urgency.  Musculoskeletal:  Negative for joint pain and myalgias.  Neurological:  Negative for dizziness and headaches.  Endo/Heme/Allergies:  Does not bruise/bleed easily.  Psychiatric/Behavioral:  Negative for depression. The patient is not nervous/anxious and does not have insomnia.        Pain Assessment       Current Medications & Allergies (verified) Allergies as of 05/21/2022       Reactions   Metoprolol Diarrhea, Other  (See Comments)   Chest pain        Medication List        Accurate as of May 21, 2022  9:55 AM. If you have any questions, ask your nurse or doctor.          amLODipine 10 MG tablet Commonly known as: NORVASC Take 1 tablet (10 mg total) by mouth daily.   aspirin EC 325 MG tablet Take by mouth.   folic acid 1 MG tablet Commonly known as: FOLVITE Take 2 tablets (2 mg total) by mouth daily.   lisinopril-hydrochlorothiazide 20-12.5 MG tablet Commonly known as: ZESTORETIC Take 2 tablets by mouth daily.   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     Past Surgical History:  Procedure Laterality Date   orthoscopic     Family History  Problem Relation Age of Onset   Cancer Mother    Diabetes Father    Hyperlipidemia Father    Hypertension Father    Stroke Father    Hypertension Brother    Social History   Socioeconomic History   Marital status: Single    Spouse name: Not on file   Number of  children: Not on file   Years of education: Not on file   Highest education level: Not on file  Occupational History   Not on file  Tobacco Use   Smoking status: Never   Smokeless tobacco: Never  Vaping Use   Vaping Use: Never used  Substance and Sexual Activity   Alcohol use: Yes    Alcohol/week: 0.0 standard drinks of alcohol    Comment: 4/week   Drug use: Yes    Types: Marijuana   Sexual activity: Yes    Partners: Female  Other Topics Concern   Not on file  Social History Narrative   Not on file   Social Determinants of Health   Financial Resource Strain: Not on file  Food Insecurity: Not on file  Transportation Needs: Not on file   Physical Activity: Not on file  Stress: Not on file  Social Connections: Not on file    Activities of Daily Living    05/21/2022    9:48 AM  In your present state of health, do you have any difficulty performing the following activities:  Hearing? 0  Vision? 0  Difficulty concentrating or making decisions? 0  Walking or climbing stairs? 0  Dressing or bathing? 0  Doing errands, shopping? 0    Patient Education/Literacy:    Exercise Current Exercise Habits: Home exercise routine, Type of exercise: walking;strength training/weights, Time (Minutes): 20, Frequency (Times/Week): 4, Weekly Exercise (Minutes/Week): 80, Intensity: Moderate, Exercise limited by: orthopedic condition(s)  Diet Patient reports consuming 3 meals a day and 2 snack(s) a day Patient reports that his primary diet is: Regular Patient reports that she does have regular access to food.   Depression Screen    05/21/2022    9:48 AM 10/17/2021    8:56 AM 12/03/2018    9:42 AM 07/11/2016    8:29 AM  PHQ 2/9 Scores  PHQ - 2 Score 0 0 0 0  PHQ- 9 Score    0     Fall Risk    05/07/2022    9:00 AM 05/08/2016    8:27 AM  Fall Risk   Falls in the past year? 0 No  Number falls in past yr: 0   Injury with Fall? 0   Risk for fall due to : No Fall Risks   Follow up Falls evaluation completed           Objective:    Isaac Bowen was alert and able to participate appropriately during our visit.   Today's Vitals   05/21/22 0924  BP: 123/72  Pulse: 93  SpO2: 98%  Weight: 214 lb (97.1 kg)  Height: 6\' 1"  (1.854 m)   Body mass index is 28.23 kg/m.     10/03/2016    8:19 AM 05/08/2016    8:26 AM 01/29/2016    1:51 PM 09/21/2014    9:08 AM  Advanced Directives  Does Patient Have a Medical Advance Directive? No No No No  Would patient like information on creating a medical advance directive?  No - Patient declined  No - patient declined information    Hearing/Vision  Isaac Bowen did not seem to have difficulty  with hearing/understanding during the face to face visit Reports that Isaac Bowen has had a formal eye exam by an eye care professional within the past year Reports that Isaac Bowen has not had a formal hearing evaluation within the past year  Cognitive Function:    05/21/2022    9:25 AM  6CIT Screen  What  Year? 0 points  What month? 0 points  What time? 0 points  Count back from 20 0 points  Months in reverse 0 points  Repeat phrase 2 points  Total Score 2 points   (Normal:0-7, Significant for Dysfunction: >8)  Normal Cognitive Function Screening: Yes      No data to display           Immunization & Health Maintenance Record Immunization History  Administered Date(s) Administered   COVID-19, mRNA, vaccine(Comirnaty)12 years and older 12/28/2021   Influenza Inj Mdck Quad Pf 12/19/2017   Influenza Split 03/24/2015, 11/28/2016, 12/19/2017, 12/03/2018   Influenza,inj,Quad PF,6+ Mos 03/24/2015, 11/28/2016, 12/03/2018, 01/06/2020   Influenza-Unspecified 01/05/2021, 12/28/2021   Moderna Covid-19 Vaccine Bivalent Booster 5yrs & up 01/05/2021   PFIZER(Purple Top)SARS-COV-2 Vaccination 06/22/2019, 07/13/2019, 02/11/2020   Pneumococcal Conjugate-13 01/29/2016   Tdap 03/24/2015    Health Maintenance  Topic Date Due   Zoster Vaccines- Shingrix (1 of 2) 08/07/2022 (Originally 05/09/2015)   Fecal DNA (Cologuard)  05/17/2023   Medicare Annual Wellness (AWV)  05/21/2023   DTaP/Tdap/Td (2 - Td or Tdap) 03/23/2025   INFLUENZA VACCINE  Completed   COVID-19 Vaccine  Completed   Hepatitis C Screening  Completed   HIV Screening  Completed   HPV VACCINES  Aged Out       Assessment:  This is a routine wellness examination for Isaac Bowen.  Health Maintenance: Due or Overdue There are no preventive care reminders to display for this patient.  Isaac Bowen does not need a referral for Community Assistance: Care Management:   no Social Work:    no Prescription Assistance:  no Nutrition/Diabetes  Education:  no   Plan:  Personalized Goals  Goals Addressed   None    Personalized Health Maintenance & Screening Recommendations  Shingrix    Advanced Directives: Written information was not prepared per patient's request.  Referrals & Orders Orders Placed This Encounter  Procedures   EKG 12-Lead  EKG: Sinus bradycardia with rate of 57.  Possible atrial enlargement.   Follow-up Plan Follow-up with Luetta Nutting, DO as planned Schedule f/u in 6 months for Annual physical/fasting labs    I have personally reviewed and noted the following in the patient's chart:   Medical and social history Use of alcohol, tobacco or illicit drugs  Current medications and supplements Functional ability and status Nutritional status Physical activity Advanced directives List of other physicians Hospitalizations, surgeries, and ER visits in previous 12 months Vitals Screenings to include cognitive, depression, and falls Referrals and appointments  In addition, I have reviewed and discussed with Isaac Bowen certain preventive protocols, quality metrics, and best practice recommendations. A written personalized care plan for preventive services as well as general preventive health recommendations is available and can be mailed to the patient at his request.      Luetta Nutting  05/21/2022

## 2022-05-21 NOTE — Patient Instructions (Signed)
  Mr. Astbury , Thank you for taking time to come for your Medicare Wellness Visit. I appreciate your ongoing commitment to your health goals. Please review the following plan we discussed and let me know if I can assist you in the future.   These are the goals we discussed:  Goals   None     This is a list of the screening recommended for you and due dates:  Health Maintenance  Topic Date Due   Zoster (Shingles) Vaccine (1 of 2) 08/07/2022*   Cologuard (Stool DNA test)  05/17/2023   Medicare Annual Wellness Visit  05/21/2023   DTaP/Tdap/Td vaccine (2 - Td or Tdap) 03/23/2025   Flu Shot  Completed   COVID-19 Vaccine  Completed   Hepatitis C Screening: USPSTF Recommendation to screen - Ages 18-79 yo.  Completed   HIV Screening  Completed   HPV Vaccine  Aged Out  *Topic was postponed. The date shown is not the original due date.

## 2022-06-15 ENCOUNTER — Other Ambulatory Visit: Payer: Self-pay | Admitting: Family Medicine

## 2022-06-15 DIAGNOSIS — I10 Essential (primary) hypertension: Secondary | ICD-10-CM

## 2022-11-12 ENCOUNTER — Encounter: Payer: Medicare HMO | Admitting: Family Medicine

## 2022-12-27 ENCOUNTER — Other Ambulatory Visit: Payer: Self-pay | Admitting: Family Medicine

## 2022-12-27 DIAGNOSIS — I10 Essential (primary) hypertension: Secondary | ICD-10-CM
